# Patient Record
Sex: Male | Born: 1955 | Race: White | Hispanic: No | Marital: Married | Smoking: Current every day smoker
Health system: Southern US, Community
[De-identification: ages and names within clinical notes are randomized; demographics above are authoritative.]

## PROBLEM LIST (undated history)

## (undated) DIAGNOSIS — E669 Obesity, unspecified: Secondary | ICD-10-CM

## (undated) DIAGNOSIS — J449 Chronic obstructive pulmonary disease, unspecified: Secondary | ICD-10-CM

## (undated) DIAGNOSIS — C3490 Malignant neoplasm of unspecified part of unspecified bronchus or lung: Secondary | ICD-10-CM

## (undated) DIAGNOSIS — H269 Unspecified cataract: Secondary | ICD-10-CM

## (undated) DIAGNOSIS — N19 Unspecified kidney failure: Secondary | ICD-10-CM

## (undated) DIAGNOSIS — E785 Hyperlipidemia, unspecified: Secondary | ICD-10-CM

## (undated) DIAGNOSIS — H409 Unspecified glaucoma: Secondary | ICD-10-CM

## (undated) HISTORY — DX: Unspecified glaucoma: H40.9

## (undated) HISTORY — PX: BACK SURGERY: SHX140

## (undated) HISTORY — PX: TONSILLECTOMY: SUR1361

## (undated) HISTORY — DX: Malignant neoplasm of unspecified part of unspecified bronchus or lung: C34.90

## (undated) HISTORY — DX: Obesity, unspecified: E66.9

## (undated) HISTORY — DX: Unspecified kidney failure: N19

## (undated) HISTORY — DX: Unspecified cataract: H26.9

## (undated) HISTORY — DX: Hyperlipidemia, unspecified: E78.5

---

## 2005-09-04 ENCOUNTER — Emergency Department: Payer: Self-pay | Admitting: Emergency Medicine

## 2006-12-06 ENCOUNTER — Emergency Department (HOSPITAL_COMMUNITY): Admission: EM | Admit: 2006-12-06 | Discharge: 2006-12-07 | Payer: Self-pay | Admitting: Emergency Medicine

## 2007-09-27 ENCOUNTER — Emergency Department (HOSPITAL_COMMUNITY): Admission: EM | Admit: 2007-09-27 | Discharge: 2007-09-27 | Payer: Self-pay | Admitting: *Deleted

## 2008-03-22 ENCOUNTER — Other Ambulatory Visit: Payer: Self-pay

## 2008-03-22 ENCOUNTER — Inpatient Hospital Stay: Payer: Self-pay | Admitting: Internal Medicine

## 2010-04-13 ENCOUNTER — Encounter: Admission: RE | Admit: 2010-04-13 | Discharge: 2010-04-13 | Payer: Self-pay | Admitting: Specialist

## 2010-05-18 ENCOUNTER — Inpatient Hospital Stay (HOSPITAL_COMMUNITY): Admission: RE | Admit: 2010-05-18 | Discharge: 2010-05-19 | Payer: Self-pay | Admitting: Specialist

## 2011-01-18 ENCOUNTER — Emergency Department: Payer: Self-pay | Admitting: Emergency Medicine

## 2011-02-20 LAB — SURGICAL PCR SCREEN: MRSA, PCR: NEGATIVE

## 2011-02-20 LAB — URINALYSIS, ROUTINE W REFLEX MICROSCOPIC
Glucose, UA: NEGATIVE mg/dL
Hgb urine dipstick: NEGATIVE
Nitrite: NEGATIVE
Protein, ur: >300 mg/dL — AB
Urobilinogen, UA: 0.2 mg/dL (ref 0.0–1.0)
pH: 5.5 (ref 5.0–8.0)

## 2011-02-20 LAB — COMPREHENSIVE METABOLIC PANEL
AST: 19 U/L (ref 0–37)
Alkaline Phosphatase: 57 U/L (ref 39–117)
CO2: 31 mEq/L (ref 19–32)
Calcium: 9.7 mg/dL (ref 8.4–10.5)
Chloride: 106 mEq/L (ref 96–112)
Glucose, Bld: 93 mg/dL (ref 70–99)
Sodium: 144 mEq/L (ref 135–145)
Total Protein: 6.8 g/dL (ref 6.0–8.3)

## 2011-02-20 LAB — PROTIME-INR
INR: 0.98 (ref 0.00–1.49)
Prothrombin Time: 12.9 seconds (ref 11.6–15.2)

## 2011-02-20 LAB — CBC
Hemoglobin: 15.5 g/dL (ref 13.0–17.0)
MCHC: 33.2 g/dL (ref 30.0–36.0)
Platelets: 162 10*3/uL (ref 150–400)
RBC: 4.9 MIL/uL (ref 4.22–5.81)
RDW: 13.9 % (ref 11.5–15.5)

## 2011-02-20 LAB — APTT: aPTT: 28 seconds (ref 24–37)

## 2011-09-25 ENCOUNTER — Inpatient Hospital Stay: Payer: Self-pay | Admitting: Internal Medicine

## 2011-09-28 ENCOUNTER — Emergency Department: Payer: Self-pay | Admitting: Emergency Medicine

## 2012-06-14 ENCOUNTER — Emergency Department (HOSPITAL_COMMUNITY): Payer: Self-pay

## 2012-06-14 ENCOUNTER — Encounter (HOSPITAL_COMMUNITY): Payer: Self-pay | Admitting: *Deleted

## 2012-06-14 ENCOUNTER — Inpatient Hospital Stay (HOSPITAL_COMMUNITY)
Admission: EM | Admit: 2012-06-14 | Discharge: 2012-06-16 | DRG: 191 | Disposition: A | Discharge status: Home / Self Care | Payer: MEDICAID | Attending: Internal Medicine | Admitting: Internal Medicine

## 2012-06-14 DIAGNOSIS — R7309 Other abnormal glucose: Secondary | ICD-10-CM | POA: Diagnosis present

## 2012-06-14 DIAGNOSIS — N189 Chronic kidney disease, unspecified: Secondary | ICD-10-CM | POA: Diagnosis present

## 2012-06-14 DIAGNOSIS — R0602 Shortness of breath: Secondary | ICD-10-CM | POA: Diagnosis present

## 2012-06-14 DIAGNOSIS — M549 Dorsalgia, unspecified: Secondary | ICD-10-CM | POA: Diagnosis present

## 2012-06-14 DIAGNOSIS — Z720 Tobacco use: Secondary | ICD-10-CM | POA: Diagnosis present

## 2012-06-14 DIAGNOSIS — Z6841 Body Mass Index (BMI) 40.0 and over, adult: Secondary | ICD-10-CM

## 2012-06-14 DIAGNOSIS — J441 Chronic obstructive pulmonary disease with (acute) exacerbation: Secondary | ICD-10-CM

## 2012-06-14 DIAGNOSIS — E669 Obesity, unspecified: Secondary | ICD-10-CM | POA: Diagnosis present

## 2012-06-14 DIAGNOSIS — J209 Acute bronchitis, unspecified: Principal | ICD-10-CM | POA: Diagnosis present

## 2012-06-14 DIAGNOSIS — G8929 Other chronic pain: Secondary | ICD-10-CM | POA: Diagnosis present

## 2012-06-14 DIAGNOSIS — R600 Localized edema: Secondary | ICD-10-CM

## 2012-06-14 DIAGNOSIS — Z86718 Personal history of other venous thrombosis and embolism: Secondary | ICD-10-CM

## 2012-06-14 DIAGNOSIS — F17200 Nicotine dependence, unspecified, uncomplicated: Secondary | ICD-10-CM | POA: Diagnosis present

## 2012-06-14 DIAGNOSIS — R609 Edema, unspecified: Secondary | ICD-10-CM | POA: Diagnosis present

## 2012-06-14 DIAGNOSIS — R739 Hyperglycemia, unspecified: Secondary | ICD-10-CM | POA: Diagnosis present

## 2012-06-14 DIAGNOSIS — N19 Unspecified kidney failure: Secondary | ICD-10-CM | POA: Diagnosis present

## 2012-06-14 DIAGNOSIS — J440 Chronic obstructive pulmonary disease with acute lower respiratory infection: Principal | ICD-10-CM | POA: Diagnosis present

## 2012-06-14 HISTORY — DX: Chronic obstructive pulmonary disease, unspecified: J44.9

## 2012-06-14 MED ORDER — ALBUTEROL SULFATE (5 MG/ML) 0.5% IN NEBU
2.5000 mg | INHALATION_SOLUTION | Freq: Once | RESPIRATORY_TRACT | Status: AC
  Administered 2012-06-14: 2.5 mg via RESPIRATORY_TRACT
  Filled 2012-06-14: qty 1

## 2012-06-14 MED ORDER — IPRATROPIUM BROMIDE 0.02 % IN SOLN
0.5000 mg | Freq: Once | RESPIRATORY_TRACT | Status: AC
  Administered 2012-06-14: 0.5 mg via RESPIRATORY_TRACT
  Filled 2012-06-14: qty 2.5

## 2012-06-14 MED ORDER — PREDNISONE 20 MG PO TABS
60.0000 mg | ORAL_TABLET | Freq: Once | ORAL | Status: AC
  Administered 2012-06-14: 60 mg via ORAL
  Filled 2012-06-14: qty 3

## 2012-06-14 MED ORDER — IPRATROPIUM BROMIDE 0.02 % IN SOLN
1.0000 mg | Freq: Once | RESPIRATORY_TRACT | Status: AC
  Administered 2012-06-14: 1 mg via RESPIRATORY_TRACT
  Filled 2012-06-14: qty 2.5

## 2012-06-14 MED ORDER — ALBUTEROL (5 MG/ML) CONTINUOUS INHALATION SOLN
INHALATION_SOLUTION | RESPIRATORY_TRACT | Status: AC
  Filled 2012-06-14: qty 20

## 2012-06-14 MED ORDER — ALBUTEROL SULFATE (5 MG/ML) 0.5% IN NEBU
10.0000 mg | INHALATION_SOLUTION | Freq: Once | RESPIRATORY_TRACT | Status: AC
  Administered 2012-06-14: 10 mg via RESPIRATORY_TRACT

## 2012-06-14 NOTE — ED Provider Notes (Signed)
History     CSN: [id number]  Arrival date & time 06/14/12  2009   First MD Initiated Contact with Patient 06/14/12 2030      Chief Complaint  Patient presents with  . Shortness of Breath    HPI Pt was seen at 2035.  Per pt, c/o gradual onset and persistence of constant cough and SOB for the past 3 days.  Denies CP/palpitations, no back pain, no abd pain, no N/V/D, no fevers, no rash.     Past Medical History  Diagnosis Date  . COPD (chronic obstructive pulmonary disease)     Past Surgical History  Procedure Date  . Back surgery     History  Substance Use Topics  . Smoking status: Current Everyday Smoker -- 1.0 packs/day    Types: Cigarettes  . Smokeless tobacco: Not on file  . Alcohol Use: No    Review of Systems ROS: Statement: All systems negative except as marked or noted in the HPI; Constitutional: Negative for fever and chills. ; ; Eyes: Negative for eye pain, redness and discharge. ; ; ENMT: Negative for ear pain, hoarseness, nasal congestion, sinus pressure and sore throat. ; ; Cardiovascular: Negative for chest pain, palpitations, diaphoresis. ; ; Respiratory: +cough, SOB. Negative for wheezing and stridor. ; ; Gastrointestinal: Negative for nausea, vomiting, diarrhea, abdominal pain, blood in stool, hematemesis, jaundice and rectal bleeding. . ; ; Genitourinary: Negative for dysuria, flank pain and hematuria. ; ; Musculoskeletal: Negative for back pain and neck pain. Negative for swelling and trauma.; ; Skin: Negative for pruritus, rash, abrasions, blisters, bruising and skin lesion.; ; Neuro: Negative for headache, lightheadedness and neck stiffness. Negative for weakness, altered level of consciousness , altered mental status, extremity weakness, paresthesias, involuntary movement, seizure and syncope.     Allergies  Levaquin  Home Medications   Current Outpatient Rx  Name Route Sig Dispense Refill  . OXYCODONE-ACETAMINOPHEN 10-650 MG PO TABS Oral Take 1  tablet by mouth 2 (two) times daily.      BP 120/78  Pulse 103  Temp 98.2 F (36.8 C) (Oral)  Resp 26  Ht 6' (1.829 m)  Wt 290 lb (131.543 kg)  BMI 39.33 kg/m2  SpO2 96%  Physical Exam 2040: Physical examination:  Nursing notes reviewed; Vital signs and O2 SAT reviewed;  Constitutional: Well developed, Well nourished, Well hydrated, In no acute distress; Head:  Normocephalic, atraumatic; Eyes: EOMI, PERRL, No scleral icterus; ENMT: Mouth and pharynx normal, Mucous membranes moist; Neck: Supple, Full range of motion, No lymphadenopathy; Cardiovascular: Regular rate and rhythm, No gallop; Respiratory: Breath sounds coarse and diminished bilaterally, No wheezes.  Speaking full sentences with ease, Normal respiratory effort/excursion; Chest: Nontender, Movement normal; Abdomen: Soft, Nontender, Nondistended, Normal bowel sounds; Genitourinary: No CVA tenderness; Extremities: Pulses normal, No tenderness, 1+ pedal edema LLE/2+ pedal edema RLE with chronic stasis changes; Neuro: AA&Ox3, Major CN grossly intact.  Speech clear. No gross focal motor or sensory deficits in extremities.; Skin: Color normal, Warm, Dry.   ED Course  Procedures   MDM  MDM Reviewed: nursing note, vitals and previous chart Interpretation: x-ray     2220:  Pt's PCXR has just been completed but not read by Rads MD yet.  Pt has just started his continuous neb, has not received his prednisone.  Appears exacerbation COPD at this time, needs re-eval after tx, may need admit.  Sign out to Dr. Lynelle Doctor.         Laray Anger, DO 06/15/12 1258

## 2012-06-14 NOTE — ED Notes (Addendum)
Pt reporting SOB x3 days.  Reports difficulty laying flat. Reporting frequent cough, reporting cough is productive.

## 2012-06-15 ENCOUNTER — Encounter (HOSPITAL_COMMUNITY): Payer: Self-pay | Admitting: Internal Medicine

## 2012-06-15 ENCOUNTER — Inpatient Hospital Stay (HOSPITAL_COMMUNITY): Payer: Self-pay

## 2012-06-15 DIAGNOSIS — R609 Edema, unspecified: Secondary | ICD-10-CM

## 2012-06-15 DIAGNOSIS — N19 Unspecified kidney failure: Secondary | ICD-10-CM | POA: Diagnosis present

## 2012-06-15 DIAGNOSIS — Z720 Tobacco use: Secondary | ICD-10-CM | POA: Diagnosis present

## 2012-06-15 DIAGNOSIS — R739 Hyperglycemia, unspecified: Secondary | ICD-10-CM | POA: Diagnosis present

## 2012-06-15 DIAGNOSIS — R0602 Shortness of breath: Secondary | ICD-10-CM | POA: Diagnosis present

## 2012-06-15 DIAGNOSIS — G8929 Other chronic pain: Secondary | ICD-10-CM | POA: Diagnosis present

## 2012-06-15 DIAGNOSIS — J209 Acute bronchitis, unspecified: Secondary | ICD-10-CM | POA: Diagnosis present

## 2012-06-15 DIAGNOSIS — J441 Chronic obstructive pulmonary disease with (acute) exacerbation: Secondary | ICD-10-CM

## 2012-06-15 DIAGNOSIS — R7309 Other abnormal glucose: Secondary | ICD-10-CM

## 2012-06-15 HISTORY — DX: Unspecified kidney failure: N19

## 2012-06-15 LAB — URINE MICROSCOPIC-ADD ON

## 2012-06-15 LAB — GLUCOSE, CAPILLARY: Glucose-Capillary: 189 mg/dL — ABNORMAL HIGH (ref 70–99)

## 2012-06-15 LAB — CBC WITH DIFFERENTIAL/PLATELET
Basophils Absolute: 0.1 10*3/uL (ref 0.0–0.1)
Basophils Relative: 1 % (ref 0–1)
Hemoglobin: 14.8 g/dL (ref 13.0–17.0)
Lymphocytes Relative: 17 % (ref 12–46)
MCHC: 32.5 g/dL (ref 30.0–36.0)
Monocytes Relative: 7 % (ref 3–12)
Neutro Abs: 8.9 10*3/uL — ABNORMAL HIGH (ref 1.7–7.7)
Neutrophils Relative %: 73 % (ref 43–77)
WBC: 12.2 10*3/uL — ABNORMAL HIGH (ref 4.0–10.5)

## 2012-06-15 LAB — COMPREHENSIVE METABOLIC PANEL
ALT: 13 U/L (ref 0–53)
AST: 13 U/L (ref 0–37)
Albumin: 3.7 g/dL (ref 3.5–5.2)
Alkaline Phosphatase: 88 U/L (ref 39–117)
Alkaline Phosphatase: 75 U/L (ref 39–117)
BUN: 20 mg/dL (ref 6–23)
BUN: 20 mg/dL (ref 6–23)
CO2: 28 mEq/L (ref 19–32)
Chloride: 99 mEq/L (ref 96–112)
Chloride: 98 mEq/L (ref 96–112)
GFR calc Af Amer: 51 mL/min — ABNORMAL LOW (ref >90)
Glucose, Bld: 152 mg/dL — ABNORMAL HIGH (ref 70–99)
Potassium: 3.7 mEq/L (ref 3.5–5.1)
Potassium: 4.7 mEq/L (ref 3.5–5.1)
Sodium: 136 mEq/L (ref 135–145)
Total Bilirubin: 0.2 mg/dL — ABNORMAL LOW (ref 0.3–1.2)
Total Bilirubin: 0.2 mg/dL — ABNORMAL LOW (ref 0.3–1.2)
Total Protein: 7.2 g/dL (ref 6.0–8.3)

## 2012-06-15 LAB — URINALYSIS, ROUTINE W REFLEX MICROSCOPIC
Bilirubin Urine: NEGATIVE
Glucose, UA: NEGATIVE mg/dL
Specific Gravity, Urine: <1.005 — ABNORMAL LOW (ref 1.005–1.030)
pH: 6 (ref 5.0–8.0)

## 2012-06-15 LAB — CBC
HCT: 44.8 % (ref 39.0–52.0)
Hemoglobin: 14.7 g/dL (ref 13.0–17.0)
MCHC: 32.8 g/dL (ref 30.0–36.0)

## 2012-06-15 LAB — PRO B NATRIURETIC PEPTIDE: Pro B Natriuretic peptide (BNP): 60.4 pg/mL (ref 0–125)

## 2012-06-15 MED ORDER — ALBUTEROL SULFATE (5 MG/ML) 0.5% IN NEBU
2.5000 mg | INHALATION_SOLUTION | RESPIRATORY_TRACT | Status: DC
  Administered 2012-06-15 – 2012-06-16 (×5): 2.5 mg via RESPIRATORY_TRACT
  Filled 2012-06-15 (×6): qty 0.5

## 2012-06-15 MED ORDER — INSULIN ASPART 100 UNIT/ML ~~LOC~~ SOLN: 0.0000 [IU] | Freq: Every day | SUBCUTANEOUS | Status: DC

## 2012-06-15 MED ORDER — OXYCODONE HCL 5 MG PO TABS: 5.0000 mg | ORAL_TABLET | ORAL | Status: DC | PRN

## 2012-06-15 MED ORDER — ONDANSETRON HCL 4 MG/2ML IJ SOLN: 4.0000 mg | INTRAMUSCULAR | Status: DC | PRN

## 2012-06-15 MED ORDER — GUAIFENESIN ER 600 MG PO TB12
1200.0000 mg | ORAL_TABLET | Freq: Two times a day (BID) | ORAL | Status: DC
  Filled 2012-06-15: qty 2

## 2012-06-15 MED ORDER — SODIUM CHLORIDE 0.9 % IV SOLN: INTRAVENOUS | Status: DC

## 2012-06-15 MED ORDER — SODIUM CHLORIDE 0.9 % IJ SOLN
3.0000 mL | Freq: Two times a day (BID) | INTRAMUSCULAR | Status: DC
  Administered 2012-06-15 – 2012-06-16 (×2): 3 mL via INTRAVENOUS
  Filled 2012-06-15 (×3): qty 3

## 2012-06-15 MED ORDER — DEXTROSE 5 % IV SOLN
500.0000 mg | INTRAVENOUS | Status: DC
  Filled 2012-06-15 (×2): qty 500

## 2012-06-15 MED ORDER — IPRATROPIUM BROMIDE 0.02 % IN SOLN
0.5000 mg | Freq: Four times a day (QID) | RESPIRATORY_TRACT | Status: DC
  Administered 2012-06-15 – 2012-06-16 (×4): 0.5 mg via RESPIRATORY_TRACT
  Filled 2012-06-15 (×5): qty 2.5

## 2012-06-15 MED ORDER — ENOXAPARIN SODIUM 40 MG/0.4ML ~~LOC~~ SOLN
40.0000 mg | SUBCUTANEOUS | Status: DC
  Administered 2012-06-16: 40 mg via SUBCUTANEOUS
  Filled 2012-06-15: qty 0.4

## 2012-06-15 MED ORDER — METHYLPREDNISOLONE SODIUM SUCC 125 MG IJ SOLR
125.0000 mg | Freq: Four times a day (QID) | INTRAMUSCULAR | Status: DC
  Administered 2012-06-15: 125 mg via INTRAVENOUS
  Filled 2012-06-15: qty 2

## 2012-06-15 MED ORDER — IPRATROPIUM BROMIDE 0.02 % IN SOLN
0.5000 mg | Freq: Once | RESPIRATORY_TRACT | Status: AC
  Administered 2012-06-15: 0.5 mg via RESPIRATORY_TRACT
  Filled 2012-06-15: qty 2.5

## 2012-06-15 MED ORDER — SODIUM CHLORIDE 0.9 % IV BOLUS (SEPSIS): 1000.0000 mL | Freq: Once | INTRAVENOUS | Status: AC

## 2012-06-15 MED ORDER — TRAZODONE HCL 50 MG PO TABS: 25.0000 mg | ORAL_TABLET | Freq: Every evening | ORAL | Status: DC | PRN

## 2012-06-15 MED ORDER — ALBUTEROL SULFATE (5 MG/ML) 0.5% IN NEBU: 2.5000 mg | INHALATION_SOLUTION | RESPIRATORY_TRACT | Status: DC | PRN

## 2012-06-15 MED ORDER — ALBUTEROL SULFATE (5 MG/ML) 0.5% IN NEBU
5.0000 mg | INHALATION_SOLUTION | Freq: Once | RESPIRATORY_TRACT | Status: AC
  Administered 2012-06-15: 5 mg via RESPIRATORY_TRACT
  Filled 2012-06-15: qty 1

## 2012-06-15 MED ORDER — DOCUSATE SODIUM 100 MG PO CAPS
100.0000 mg | ORAL_CAPSULE | Freq: Two times a day (BID) | ORAL | Status: DC
  Administered 2012-06-15 – 2012-06-16 (×3): 100 mg via ORAL
  Filled 2012-06-15 (×3): qty 1

## 2012-06-15 MED ORDER — ENOXAPARIN SODIUM 150 MG/ML ~~LOC~~ SOLN
1.0000 mg/kg | Freq: Two times a day (BID) | SUBCUTANEOUS | Status: DC
  Administered 2012-06-15: 130 mg via SUBCUTANEOUS
  Filled 2012-06-15 (×6): qty 1

## 2012-06-15 MED ORDER — DEXTROSE 5 % IV SOLN
1.0000 g | INTRAVENOUS | Status: DC
  Filled 2012-06-15 (×2): qty 10

## 2012-06-15 MED ORDER — PREDNISONE 20 MG PO TABS
60.0000 mg | ORAL_TABLET | Freq: Two times a day (BID) | ORAL | Status: DC
  Administered 2012-06-15 – 2012-06-16 (×3): 60 mg via ORAL
  Filled 2012-06-15 (×3): qty 3

## 2012-06-15 MED ORDER — ENOXAPARIN SODIUM 30 MG/0.3ML ~~LOC~~ SOLN
130.0000 mg | Freq: Once | SUBCUTANEOUS | Status: DC
  Filled 2012-06-15: qty 0.3

## 2012-06-15 MED ORDER — BISACODYL 5 MG PO TBEC: 5.0000 mg | DELAYED_RELEASE_TABLET | Freq: Every day | ORAL | Status: DC | PRN

## 2012-06-15 MED ORDER — IPRATROPIUM BROMIDE 0.02 % IN SOLN
0.5000 mg | RESPIRATORY_TRACT | Status: DC
  Administered 2012-06-15: 0.5 mg via RESPIRATORY_TRACT
  Filled 2012-06-15: qty 2.5

## 2012-06-15 MED ORDER — INSULIN ASPART 100 UNIT/ML ~~LOC~~ SOLN
0.0000 [IU] | Freq: Three times a day (TID) | SUBCUTANEOUS | Status: DC
  Administered 2012-06-15 (×2): 3 [IU] via SUBCUTANEOUS
  Administered 2012-06-15 – 2012-06-16 (×2): 2 [IU] via SUBCUTANEOUS

## 2012-06-15 MED ORDER — BENZONATATE 100 MG PO CAPS
200.0000 mg | ORAL_CAPSULE | Freq: Three times a day (TID) | ORAL | Status: DC
  Administered 2012-06-15 – 2012-06-16 (×4): 200 mg via ORAL
  Filled 2012-06-15 (×4): qty 2

## 2012-06-15 MED ORDER — DEXTROSE 5 % IV SOLN
1.0000 g | Freq: Once | INTRAVENOUS | Status: AC
  Administered 2012-06-15: 1 g via INTRAVENOUS
  Filled 2012-06-15: qty 10

## 2012-06-15 MED ORDER — SODIUM CHLORIDE 0.9 % IV SOLN
INTRAVENOUS | Status: DC
  Administered 2012-06-15: 05:00:00 via INTRAVENOUS

## 2012-06-15 MED ORDER — AZITHROMYCIN 250 MG PO TABS
250.0000 mg | ORAL_TABLET | Freq: Every day | ORAL | Status: DC
  Administered 2012-06-15 – 2012-06-16 (×2): 250 mg via ORAL
  Filled 2012-06-15 (×2): qty 1

## 2012-06-15 MED ORDER — SODIUM CHLORIDE 0.9 % IV BOLUS (SEPSIS)
500.0000 mL | INTRAVENOUS | Status: AC
  Administered 2012-06-15: 500 mL via INTRAVENOUS

## 2012-06-15 MED ORDER — AZITHROMYCIN 500 MG IV SOLR
500.0000 mg | Freq: Once | INTRAVENOUS | Status: AC
  Administered 2012-06-15: 500 mg via INTRAVENOUS
  Filled 2012-06-15: qty 500

## 2012-06-15 MED ORDER — ACETAMINOPHEN 650 MG RE SUPP: 650.0000 mg | Freq: Four times a day (QID) | RECTAL | Status: DC | PRN

## 2012-06-15 MED ORDER — ACETAMINOPHEN 325 MG PO TABS: 650.0000 mg | ORAL_TABLET | ORAL | Status: DC | PRN

## 2012-06-15 MED ORDER — HYDROMORPHONE HCL PF 1 MG/ML IJ SOLN
0.5000 mg | INTRAMUSCULAR | Status: DC | PRN
  Administered 2012-06-15 (×4): 0.5 mg via INTRAVENOUS
  Filled 2012-06-15 (×4): qty 1

## 2012-06-15 MED ORDER — NICOTINE 21 MG/24HR TD PT24
21.0000 mg | MEDICATED_PATCH | Freq: Every day | TRANSDERMAL | Status: DC
  Administered 2012-06-15 – 2012-06-16 (×2): 21 mg via TRANSDERMAL
  Filled 2012-06-15 (×3): qty 1

## 2012-06-15 NOTE — Progress Notes (Signed)
Patient admitted after midnight. Notes reviewed. Patient examined. No longer wheezing. Doppler shows no DVT. See orders.

## 2012-06-15 NOTE — ED Provider Notes (Cosign Needed)
History     CSN: [id number]  Arrival date & time 06/14/12  2009   First MD Initiated Contact with Patient 06/14/12 2030      Chief Complaint  Patient presents with  . Shortness of Breath    (Consider location/radiation/quality/duration/timing/severity/associated sxs/prior treatment) HPI  Patient left to me at change of  shift. Recheck after continuous nebulizer and oral prednisone shows very poor air movement and patient noted to be coughing frequently and appears to be tight. He states he was last admitted to hospital in October when he had pneumonia. Patient also concerned about swelling in his right leg he has had intermittently but has gotten constant. Pt continues to smoke    Past Medical History  Diagnosis Date  . COPD (chronic obstructive pulmonary disease)      Review of Systems  NA  Allergies  Levaquin       Physical Exam   ED Course  Procedures (including critical care time)   Medications  0.9 %  sodium chloride infusion (not administered)  sodium chloride 0.9 % bolus 500 mL (not administered)  enoxaparin (LOVENOX) injection 130 mg (not administered)  cefTRIAXone (ROCEPHIN) 1 g in dextrose 5 % 50 mL IVPB (not administered)  azithromycin (ZITHROMAX) 500 mg in dextrose 5 % 250 mL IVPB (not administered)  albuterol (PROVENTIL) (5 MG/ML) 0.5% nebulizer solution 2.5 mg (2.5 mg Nebulization Given 06/14/12 2022)  ipratropium (ATROVENT) nebulizer solution 0.5 mg (0.5 mg Nebulization Given 06/14/12 2023)  albuterol (PROVENTIL) (5 MG/ML) 0.5% nebulizer solution 10 mg (10 mg Nebulization Given 06/14/12 2108)  ipratropium (ATROVENT) nebulizer solution 1 mg (1 mg Nebulization Given 06/14/12 2108)  predniSONE (DELTASONE) tablet 60 mg (60 mg Oral Given 06/14/12 2251)  albuterol (PROVENTIL) (5 MG/ML) 0.5% nebulizer solution 5 mg (5 mg Nebulization Given 06/15/12 0112)  ipratropium (ATROVENT) nebulizer solution 0.5 mg (0.5 mg Nebulization Given 06/15/12 0112)    After  continuous nebulizer, the patient had IV inserted and received IV antibiotics in another albuterol nebulizer treatment. It was felt he needed to be admitted patient is agreeable.  01:36 Dr Orvan Falconer admit to telemetry     Results for orders placed during the hospital encounter of 06/14/12  CBC WITH DIFFERENTIAL      Component Value Range   WBC 12.2 (*) 4.0 - 10.5 K/uL   RBC 4.81  4.22 - 5.81 MIL/uL   Hemoglobin 14.8  13.0 - 17.0 g/dL   HCT 40.9  81.1 - 91.4 %   MCV 94.6  78.0 - 100.0 fL   MCH 30.8  26.0 - 34.0 pg   MCHC 32.5  30.0 - 36.0 g/dL   RDW 78.2  95.6 - 21.3 %   Platelets 186  150 - 400 K/uL   Neutrophils Relative 73  43 - 77 %   Neutro Abs 8.9 (*) 1.7 - 7.7 K/uL   Lymphocytes Relative 17  12 - 46 %   Lymphs Abs 2.1  0.7 - 4.0 K/uL   Monocytes Relative 7  3 - 12 %   Monocytes Absolute 0.9  0.1 - 1.0 K/uL   Eosinophils Relative 2  0 - 5 %   Eosinophils Absolute 0.2  0.0 - 0.7 K/uL   Basophils Relative 1  0 - 1 %   Basophils Absolute 0.1  0.0 - 0.1 K/uL  COMPREHENSIVE METABOLIC PANEL      Component Value Range   Sodium 139  135 - 145 mEq/L   Potassium 3.7  3.5 - 5.1 mEq/L  Chloride 99  96 - 112 mEq/L   CO2 28  19 - 32 mEq/L   Glucose, Bld 157 (*) 70 - 99 mg/dL   BUN 20  6 - 23 mg/dL   Creatinine, Ser 7.82 (*) 0.50 - 1.35 mg/dL   Calcium 9.9  8.4 - 95.6 mg/dL   Total Protein 7.4  6.0 - 8.3 g/dL   Albumin 3.7  3.5 - 5.2 g/dL   AST 13  0 - 37 U/L   ALT 14  0 - 53 U/L   Alkaline Phosphatase 88  39 - 117 U/L   Total Bilirubin 0.2 (*) 0.3 - 1.2 mg/dL   GFR calc non Af Amer 43 (*) >90 mL/min   GFR calc Af Amer 50 (*) >90 mL/min  D-DIMER, QUANTITATIVE      Component Value Range   D-Dimer, Quant 0.29  0.00 - 0.48 ug/mL-FEU   Laboratory interpretation all normal except , hyperglycemia   Dg Chest Port 1 View  06/14/2012  *RADIOLOGY REPORT*  Clinical Data: Respiratory distress.  PORTABLE CHEST - 1 VIEW  Comparison: None.  Findings: Mild cardiomegaly.  Mildly tortuous  aorta.  No gross pneumothorax.  Question abnormality below the right cardiac lead peripheral aspect right upper lung zone.  The patient would eventually benefit from follow-up two-view chest with cardiac leads removed.  Central pulmonary vascular prominence without pulmonary edema.  IMPRESSION: Mild cardiomegaly.  Central pulmonary vascular prominence without pulmonary edema.  Follow-up two-view chest recommended to evaluate the hila structures and possible opacity peripheral aspect right upper lobe.  Original Report Authenticated By: Fuller Canada, M.D.    1. COPD with acute exacerbation    Plan admission  Devoria Albe, MD, FACEP    MDM          Ward Givens, MD 06/15/12 [id number]

## 2012-06-15 NOTE — Progress Notes (Signed)
ANTICOAGULATION CONSULT NOTE - Initial Consult  Pharmacy Consult for Lovenox Indication: VTE treatment  Allergies  Allergen Reactions  . Levaquin (Levofloxacin) Other (See Comments)    REACTION: ORAL rash (breakout)    Patient Measurements: Height: 6' (182.9 cm) Weight: 290 lb (131.543 kg) IBW/kg (Calculated) : 77.6   Vital Signs: Temp: 98.2 F (36.8 C) (07/12 2010) Temp src: Oral (07/12 2010) BP: 102/61 mmHg (07/13 0014) Pulse Rate: 99  (07/13 0014)  Labs:  Basename 06/15/12 0025  HGB 14.8  HCT 45.5  PLT 186  APTT --  LABPROT --  INR --  HEPARINUNFRC --  CREATININE 1.70*  CKTOTAL --  CKMB --  TROPONINI --    Estimated Creatinine Clearance: 68.1 ml/min (by C-G formula based on Cr of 1.7).   Medical History: Past Medical History  Diagnosis Date  . COPD (chronic obstructive pulmonary disease)     Medications:  Scheduled:    . albuterol  10 mg Nebulization Once  . albuterol  2.5 mg Nebulization Once  . albuterol  5 mg Nebulization Once  . albuterol      . azithromycin  500 mg Intravenous Once  . enoxaparin (LOVENOX) injection  1 mg/kg Subcutaneous Q12H  . ipratropium  0.5 mg Nebulization Once  . ipratropium  0.5 mg Nebulization Once  . ipratropium  1 mg Nebulization Once  . predniSONE  60 mg Oral Once  . sodium chloride  1,000 mL Intravenous Once  . sodium chloride  500 mL Intravenous STAT  . DISCONTD: enoxaparin  130 mg Subcutaneous Once    Assessment: Ok for protocol CrCl greater than 30  Goal of Therapy:  VTE treatment Monitor platelets by anticoagulation protocol: Yes   Plan:  Lovenox 1 mg/kg every 12 hours (130 mg) Monitor platelets Labs per protocol  Raquel James, Nihal Marzella Bennett 06/15/2012,2:53 AM

## 2012-06-15 NOTE — ED Notes (Signed)
Patient earlier c/o hunger - MD had advised no solids until evaluated for admission.  Dr Orvan Falconer in to evaluate.  Orders received for diet - Healthy Choice Entree provided.

## 2012-06-15 NOTE — Consult Note (Signed)
See prior note

## 2012-06-15 NOTE — H&P (Signed)
[facility] History and Physical  Andrew Estes WGN:[id number] DOB: 03-27-1956 DOA: 06/15/2012   PCP:   No primary provider on file.   Pain management:: Lillia Dallas. Murray Hodgkins, M.D., [facility] neurosurgical  Chief Complaint:  Difficulty breathing for the past 4 days  HPI: Andrew Estes is an 56 y.o. male.   Obese Caucasian gentleman with no medical followup other than pain management clinic for a workers comp injury, has a 40-pack-year tobacco history, presents with worsening shortness of breath over the past 4 days. Now with severe orthopnea coughing up yellow mucus, worsening chest pain with coughing.  He denies fever or hemoptysis. He has also noted swelling of the right lower extremity for the past 3 weeks.  In the emergency room patient was noted to have generalized wheezing and had some improvement with serial continues to have significant shortness of breath and the hospitalist service was called to assist.  Because patient has no medical followup she denies any knowledge of kidney disease or diabetes.   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  Rewiew of Systems:   All systems negative except as marked or noted in the HPI;  Constitutional: Negative for malaise, fever and chills. ;  Eyes: Negative for eye pain, redness and discharge. ;  ENMT: Negative for ear pain, hoarseness, nasal congestion, sinus pressure and sore throat. ;  Cardiovascular: Negative for, palpitations, diaphoresis, dyspnea  ;  Respiratory: Negative for  hemoptysis, and stridor. ;  Gastrointestinal: Negative for nausea, vomiting, diarrhea, constipation, abdominal pain, melena, blood in stool, hematemesis, jaundice and rectal bleeding. unusual weight loss..   Genitourinary: Negative for frequency, dysuria, incontinence,flank pain and hematuria; Musculoskeletal: Negative  neck pain.  Negative trauma.;  Skin: . Negative for pruritus, rash, abrasions, bruising and skin lesion.; ulcerations Neuro: Negative for headache, lightheadedness and neck stiffness. Negative for weakness, altered level of consciousness , altered mental status, extremity weakness, burning feet, involuntary movement, seizure and syncope.  Psych: negative for anxiety, depression, insomnia, tearfulness, panic attacks, hallucinations, paranoia, suicidal or homicidal ideation    Past Medical History  Diagnosis Date  . COPD (chronic obstructive pulmonary disease)   . Chronic kidney disease     Past Surgical History  Procedure Date  . Back surgery     Medications:  HOME MEDS: Prior to Admission medications   Medication Sig Start Date End Date Taking? Authorizing Provider  oxyCODONE-acetaminophen (PERCOCET) 10-650 MG per tablet Take 1 tablet by mouth 2 (two) times daily.   Yes Historical Provider, MD     Allergies:  Allergies  Allergen Reactions  . Levaquin (Levofloxacin) Other (See Comments)    REACTION: ORAL rash (breakout)    Social History:   reports that he has been smoking Cigarettes.  He has a 45 pack-year smoking history. He does not have any smokeless tobacco history on file. He reports that he does not drink alcohol or use illicit drugs.  Family History: Family History  Problem Relation Age of Onset  . Cancer Mother 1    pancreas  . Diverticulitis Mother   . Osteoarthritis Father    father had multiple DVTs after multiple knee replacements    Physical Exam: Filed Vitals:   06/14/12 2246 06/15/12 0014 06/15/12 0113 06/15/12 0417  BP: 122/66 102/61  132/82  Pulse: 101 99  104  Temp:    98.7 F (37.1 C)  TempSrc:  Resp: 24 24  22   Height:      Weight:      SpO2: 100% 100% 98% 97%   Blood pressure 132/82, pulse 104, temperature 98.7 F (37.1 C), temperature source Oral, resp. rate 22, height 6' (1.829 m), weight 131.543 kg (290 lb), SpO2 97.00%.  GEN:  Pleasan  obese Caucasian gentleman sitting up   the stretcher in significant respiratory  distress; cooperative with exam PSYCH:  alert and oriented x4; does appear somewhat anxious; affect is appropriate. HEENT: Mucous membranes pink and anicteric; PERRLA; EOM intact; no cervical lymphadenopathy nor thyromegaly or carotid bruit; no JVD; very thick neck  Breasts:: Not examined CHEST WALL: No tenderness CHEST:  tachypneic; mild diffuse rhonchi HEART: Regular rate and rhythm; no murmurs rubs or gallops BACK: no CVA tenderness ABDOMEN: Obese,  firm  non-tender; no masses, no organomegaly, normal abdominal bowel sounds; no pannus; no intertriginous candida. Rectal Exam: Not done EXTREMITIES:; age-appropriate arthropathy of the hands and knees;  right leg swollen and tense;  no weeping,no ulcerations. skin of both legs it was red and dusky suggestive of venous stasis  Genitalia: not examined PULSES: 2+ and symmetric SKIN: Normal hydration no rash or ulceration CNS: Cranial nerves 2-12 grossly intact no focal lateralizing neurologic deficit   Labs on Admission:  Basic Metabolic Panel:  Lab 06/15/12 1610  NA 139  K 3.7  CL 99  CO2 28  GLUCOSE 157*  BUN 20  CREATININE 1.70*  CALCIUM 9.9  MG --  PHOS --   Liver Function Tests:  Lab 06/15/12 0025  AST 13  ALT 14  ALKPHOS 88  BILITOT 0.2*  PROT 7.4  ALBUMIN 3.7   No results found for this basename: LIPASE:5,AMYLASE:5 in the last 168 hours No results found for this basename: AMMONIA:5 in the last 168 hours CBC:  Lab 06/15/12 0025  WBC 12.2*  NEUTROABS 8.9*  HGB 14.8  HCT 45.5  MCV 94.6  PLT 186   Cardiac Enzymes: No results found for this basename: CKTOTAL:5,CKMB:5,CKMBINDEX:5,TROPONINI:5 in the last 168 hours BNP: No components found with this basename: POCBNP:5 CBG: No results found for this basename: GLUCAP:5 in the last 168 hours  Radiological Exams on Admission: Dg Chest Port 1 View  06/14/2012  *RADIOLOGY REPORT*   Clinical Data: Respiratory distress.  PORTABLE CHEST - 1 VIEW  Comparison: None.  Findings: Mild cardiomegaly.  Mildly tortuous aorta.  No gross pneumothorax.  Question abnormality below the right cardiac lead peripheral aspect right upper lung zone.  The patient would eventually benefit from follow-up two-view chest with cardiac leads removed.  Central pulmonary vascular prominence without pulmonary edema.  IMPRESSION: Mild cardiomegaly.  Central pulmonary vascular prominence without pulmonary edema.  Follow-up two-view chest recommended to evaluate the hila structures and possible opacity peripheral aspect right upper lobe.  Original Report Authenticated By: Fuller Canada, M.D.      Assessment/Plan Present on Admission:  .COPD (chronic obstructive pulmonary disease) with acute bronchitis   Serial nebulizations steroids and antibiotic therapy  .SOB (shortness of breath) possible Pulmonary embolus  Her respiratory distress is out of keeping with the lung exam, although by this time patient has had steroids and nebs; [place] swollen right leg will continue to consider venous thromboembolism, check a d-dimer and begin for anti-coagulation  .Leg edema, right, psooible DVT  For anti-coagulation pending extremity Doppler; because of a history of multiple DVTs in his father will also get a hypercoagulable panel  .Renal failure, unspecified  Possibly acute possibly chronic,  specially in light of hyperglycemia; Will hydrate and monitor renal function; check urinalysis  .Obesity  Counseled on weight management and diet; he need nutritional assistance   .Hyperglycemia  Check hemoglobin A1c counseled on diet and the importance of weight loss  .Tobacco abuse  Nicotine replacement ; counseled on tobacco cessation .Chronic back pain  Continue when necessary narcotics   Other plans as per orders.  Code Status:  full code  Family Communication: Patient was interviewed and examined with his permission  in the presence of his daughter and son-in-law  Disposition Plan: will depend on his response to treatment over the next few days; and the results of his venous thromboembolism exam   Critical care time: 60 minutes.   Claudia Greenley Nocturnist [facility] Pager [phone]  06/15/2012, 4:25 AM

## 2012-06-16 DIAGNOSIS — F17200 Nicotine dependence, unspecified, uncomplicated: Secondary | ICD-10-CM

## 2012-06-16 DIAGNOSIS — J209 Acute bronchitis, unspecified: Secondary | ICD-10-CM

## 2012-06-16 DIAGNOSIS — J440 Chronic obstructive pulmonary disease with acute lower respiratory infection: Secondary | ICD-10-CM

## 2012-06-16 DIAGNOSIS — J441 Chronic obstructive pulmonary disease with (acute) exacerbation: Secondary | ICD-10-CM

## 2012-06-16 DIAGNOSIS — R609 Edema, unspecified: Secondary | ICD-10-CM

## 2012-06-16 LAB — GLUCOSE, CAPILLARY: Glucose-Capillary: 134 mg/dL — ABNORMAL HIGH (ref 70–99)

## 2012-06-16 LAB — CBC
MCH: 30.6 pg (ref 26.0–34.0)
MCHC: 32.7 g/dL (ref 30.0–36.0)
MCV: 93.5 fL (ref 78.0–100.0)
Platelets: 214 10*3/uL (ref 150–400)
RDW: 13.3 % (ref 11.5–15.5)

## 2012-06-16 LAB — LIPID PANEL
Cholesterol: 234 mg/dL — ABNORMAL HIGH (ref 0–200)
HDL: 43 mg/dL (ref >39)
Total CHOL/HDL Ratio: 5.4 RATIO
VLDL: 23 mg/dL (ref 0–40)

## 2012-06-16 LAB — BASIC METABOLIC PANEL
Calcium: 9.9 mg/dL (ref 8.4–10.5)
Creatinine, Ser: 1.55 mg/dL — ABNORMAL HIGH (ref 0.50–1.35)
GFR calc Af Amer: 56 mL/min — ABNORMAL LOW (ref >90)
GFR calc non Af Amer: 48 mL/min — ABNORMAL LOW (ref >90)
Sodium: 138 mEq/L (ref 135–145)

## 2012-06-16 LAB — TSH: TSH: 2.118 u[IU]/mL (ref 0.350–4.500)

## 2012-06-16 LAB — HOMOCYSTEINE: Homocysteine: 15.4 umol/L (ref 4.0–15.4)

## 2012-06-16 MED ORDER — FUROSEMIDE 20 MG PO TABS: 20.0000 mg | ORAL_TABLET | Freq: Every day | ORAL | Status: DC | PRN

## 2012-06-16 MED ORDER — ALBUTEROL SULFATE HFA 108 (90 BASE) MCG/ACT IN AERS: 2.0000 | INHALATION_SPRAY | RESPIRATORY_TRACT | Status: DC | PRN

## 2012-06-16 MED ORDER — PREDNISONE (PAK) 10 MG PO TABS: 10.0000 mg | ORAL_TABLET | Freq: Every day | ORAL | Status: AC

## 2012-06-16 MED ORDER — AZITHROMYCIN 250 MG PO TABS: 250.0000 mg | ORAL_TABLET | Freq: Every day | ORAL | Status: DC

## 2012-06-16 NOTE — Progress Notes (Signed)
Reviewed discharge instructions with pt and he is able to teach back instructions.  Pt is alert and oriented, VSS.  Pt has requested to walk downstairs.  Anitah, NT will escort pt downstairs to lobby.  Pt has belongings in hand along with discharge instructions and prescriptions.

## 2012-06-16 NOTE — Discharge Summary (Signed)
Physician Discharge Summary  Patient ID: Andrew Estes MRN: [id number] DOB/AGE: December 19, 1955 56 y.o.  Admit date: 06/14/2012 Discharge date: 06/16/2012  Discharge Diagnoses:  Active Problems:  COPD (chronic obstructive pulmonary disease) with acute bronchitis  SOB (shortness of breath) possible Pulmonary embolus  Leg edema, right, psooible DVT  Renal failure, unspecified  Obesity  Hyperglycemia  Tobacco abuse  Chronic back pain   Medication List  As of 06/16/2012 10:22 AM   TAKE these medications         albuterol 108 (90 BASE) MCG/ACT inhaler   Commonly known as: PROVENTIL HFA;VENTOLIN HFA   Inhale 2 puffs into the lungs every 4 (four) hours as needed for wheezing.      azithromycin 250 MG tablet   Commonly known as: ZITHROMAX   Take 1 tablet (250 mg total) by mouth daily.      oxyCODONE-acetaminophen 10-650 MG per tablet   Commonly known as: PERCOCET   Take 1 tablet by mouth 2 (two) times daily.      predniSONE 10 MG tablet   Commonly known as: STERAPRED UNI-PAK   Take 1 tablet (10 mg total) by mouth daily. 4 tablets daily then decrease by 1 tablet every 2 days until off           furosemide 20 mg daily as needed for swelling  Discharge Orders    Future Orders Please Complete By Expires   Diet general      Increase activity slowly      Discharge instructions      Comments:   Quit smoking      Follow-up Information    Schedule an appointment as soon as possible for a [facility] with [facility] if you qualify.       Disposition: home  Discharged Condition: stable  Consults:  none  Labs:   Results for orders placed during the hospital encounter of 06/14/12 (from the past 48 hour(s))  CBC WITH DIFFERENTIAL     Status: Abnormal   Collection Time   06/15/12 12:25 AM      Component Value Range Comment   WBC 12.2 (*) 4.0 - 10.5 K/uL    RBC 4.81  4.22 - 5.81 MIL/uL    Hemoglobin 14.8  13.0 - 17.0 g/dL    HCT 78.2  95.6 - 21.3 %    MCV 94.6  78.0 - 100.0  fL    MCH 30.8  26.0 - 34.0 pg    MCHC 32.5  30.0 - 36.0 g/dL    RDW 08.6  57.8 - 46.9 %    Platelets 186  150 - 400 K/uL    Neutrophils Relative 73  43 - 77 %    Neutro Abs 8.9 (*) 1.7 - 7.7 K/uL    Lymphocytes Relative 17  12 - 46 %    Lymphs Abs 2.1  0.7 - 4.0 K/uL    Monocytes Relative 7  3 - 12 %    Monocytes Absolute 0.9  0.1 - 1.0 K/uL    Eosinophils Relative 2  0 - 5 %    Eosinophils Absolute 0.2  0.0 - 0.7 K/uL    Basophils Relative 1  0 - 1 %    Basophils Absolute 0.1  0.0 - 0.1 K/uL   COMPREHENSIVE METABOLIC PANEL     Status: Abnormal   Collection Time   06/15/12 12:25 AM      Component Value Range Comment   Sodium 139  135 - 145 mEq/L  Potassium 3.7  3.5 - 5.1 mEq/L    Chloride 99  96 - 112 mEq/L    CO2 28  19 - 32 mEq/L    Glucose, Bld 157 (*) 70 - 99 mg/dL    BUN 20  6 - 23 mg/dL    Creatinine, Ser 1.61 (*) 0.50 - 1.35 mg/dL    Calcium 9.9  8.4 - 09.6 mg/dL    Total Protein 7.4  6.0 - 8.3 g/dL    Albumin 3.7  3.5 - 5.2 g/dL    AST 13  0 - 37 U/L    ALT 14  0 - 53 U/L    Alkaline Phosphatase 88  39 - 117 U/L    Total Bilirubin 0.2 (*) 0.3 - 1.2 mg/dL    GFR calc non Af Amer 43 (*) >90 mL/min    GFR calc Af Amer 50 (*) >90 mL/min   D-DIMER, QUANTITATIVE     Status: Normal   Collection Time   06/15/12 12:25 AM      Component Value Range Comment   D-Dimer, Quant 0.29  0.00 - 0.48 ug/mL-FEU   ANTITHROMBIN III     Status: Normal   Collection Time   06/15/12  2:45 AM      Component Value Range Comment   AntiThromb III Func 96  75 - 120 %   HOMOCYSTEINE, SERUM     Status: Normal   Collection Time   06/15/12  2:45 AM      Component Value Range Comment   Homocysteine-Norm 15.4  4.0 - 15.4 umol/L   HEMOGLOBIN A1C     Status: Normal   Collection Time   06/15/12  2:45 AM      Component Value Range Comment   Hemoglobin A1C 5.1  <5.7 %    Mean Plasma Glucose 100  <117 mg/dL   TSH     Status: Normal   Collection Time   06/15/12  2:45 AM      Component Value Range  Comment   TSH 2.118  0.350 - 4.500 uIU/mL   URINALYSIS, ROUTINE W REFLEX MICROSCOPIC     Status: Abnormal   Collection Time   06/15/12  6:00 AM      Component Value Range Comment   Color, Urine YELLOW  YELLOW    APPearance CLEAR  CLEAR    Specific Gravity, Urine <1.005 (*) 1.005 - 1.030    pH 6.0  5.0 - 8.0    Glucose, UA NEGATIVE  NEGATIVE mg/dL    Hgb urine dipstick TRACE (*) NEGATIVE    Bilirubin Urine NEGATIVE  NEGATIVE    Ketones, ur NEGATIVE  NEGATIVE mg/dL    Protein, ur 045 (*) NEGATIVE mg/dL    Urobilinogen, UA 0.2  0.0 - 1.0 mg/dL    Nitrite NEGATIVE  NEGATIVE    Leukocytes, UA NEGATIVE  NEGATIVE   URINE MICROSCOPIC-ADD ON     Status: Abnormal   Collection Time   06/15/12  6:00 AM      Component Value Range Comment   Squamous Epithelial / LPF FEW (*) RARE    WBC, UA 0-2  <3 WBC/hpf    RBC / HPF 0-2  <3 RBC/hpf    Bacteria, UA RARE  RARE    Casts GRANULAR CAST (*) NEGATIVE   PRO B NATRIURETIC PEPTIDE     Status: Normal   Collection Time   06/15/12  7:13 AM      Component Value Range Comment   Pro  B Natriuretic peptide (BNP) 60.4  0 - 125 pg/mL   CBC     Status: Normal   Collection Time   06/15/12  7:19 AM      Component Value Range Comment   WBC 10.4  4.0 - 10.5 K/uL    RBC 4.78  4.22 - 5.81 MIL/uL    Hemoglobin 14.7  13.0 - 17.0 g/dL    HCT 16.1  09.6 - 04.5 %    MCV 93.7  78.0 - 100.0 fL    MCH 30.8  26.0 - 34.0 pg    MCHC 32.8  30.0 - 36.0 g/dL    RDW 40.9  81.1 - 91.4 %    Platelets 173  150 - 400 K/uL   COMPREHENSIVE METABOLIC PANEL     Status: Abnormal   Collection Time   06/15/12  7:19 AM      Component Value Range Comment   Sodium 136  135 - 145 mEq/L    Potassium 4.7  3.5 - 5.1 mEq/L DELTA CHECK NOTED   Chloride 98  96 - 112 mEq/L    CO2 25  19 - 32 mEq/L    Glucose, Bld 152 (*) 70 - 99 mg/dL    BUN 20  6 - 23 mg/dL    Creatinine, Ser 7.82 (*) 0.50 - 1.35 mg/dL    Calcium 9.6  8.4 - 95.6 mg/dL    Total Protein 7.2  6.0 - 8.3 g/dL    Albumin 3.4  (*) 3.5 - 5.2 g/dL    AST 13  0 - 37 U/L    ALT 13  0 - 53 U/L    Alkaline Phosphatase 75  39 - 117 U/L    Total Bilirubin 0.2 (*) 0.3 - 1.2 mg/dL    GFR calc non Af Amer 44 (*) >90 mL/min    GFR calc Af Amer 51 (*) >90 mL/min   GLUCOSE, CAPILLARY     Status: Abnormal   Collection Time   06/15/12  7:26 AM      Component Value Range Comment   Glucose-Capillary 142 (*) 70 - 99 mg/dL    Comment 1 Notify RN      Comment 2 Documented in Chart     GLUCOSE, CAPILLARY     Status: Abnormal   Collection Time   06/15/12 11:42 AM      Component Value Range Comment   Glucose-Capillary 164 (*) 70 - 99 mg/dL    Comment 1 Notify RN      Comment 2 Documented in Chart     GLUCOSE, CAPILLARY     Status: Abnormal   Collection Time   06/15/12  4:48 PM      Component Value Range Comment   Glucose-Capillary 153 (*) 70 - 99 mg/dL    Comment 1 Notify RN      Comment 2 Documented in Chart     GLUCOSE, CAPILLARY     Status: Abnormal   Collection Time   06/15/12  9:32 PM      Component Value Range Comment   Glucose-Capillary 189 (*) 70 - 99 mg/dL    Comment 1 Notify RN     LIPID PANEL     Status: Abnormal   Collection Time   06/16/12  5:50 AM      Component Value Range Comment   Cholesterol 234 (*) 0 - 200 mg/dL    Triglycerides 213  <086 mg/dL    HDL 43  >57 mg/dL  Total CHOL/HDL Ratio 5.4      VLDL 23  0 - 40 mg/dL    LDL Cholesterol 454 (*) 0 - 99 mg/dL   CBC     Status: Abnormal   Collection Time   06/16/12  5:50 AM      Component Value Range Comment   WBC 19.8 (*) 4.0 - 10.5 K/uL    RBC 4.94  4.22 - 5.81 MIL/uL    Hemoglobin 15.1  13.0 - 17.0 g/dL    HCT 09.8  11.9 - 14.7 %    MCV 93.5  78.0 - 100.0 fL    MCH 30.6  26.0 - 34.0 pg    MCHC 32.7  30.0 - 36.0 g/dL    RDW 82.9  56.2 - 13.0 %    Platelets 214  150 - 400 K/uL   BASIC METABOLIC PANEL     Status: Abnormal   Collection Time   06/16/12  5:50 AM      Component Value Range Comment   Sodium 138  135 - 145 mEq/L    Potassium 4.7   3.5 - 5.1 mEq/L    Chloride 100  96 - 112 mEq/L    CO2 29  19 - 32 mEq/L    Glucose, Bld 158 (*) 70 - 99 mg/dL    BUN 32 (*) 6 - 23 mg/dL DELTA CHECK NOTED   Creatinine, Ser 1.55 (*) 0.50 - 1.35 mg/dL    Calcium 9.9  8.4 - 86.5 mg/dL    GFR calc non Af Amer 48 (*) >90 mL/min    GFR calc Af Amer 56 (*) >90 mL/min   GLUCOSE, CAPILLARY     Status: Abnormal   Collection Time   06/16/12  7:42 AM      Component Value Range Comment   Glucose-Capillary 134 (*) 70 - 99 mg/dL    Comment 1 Notify RN      Comment 2 Documented in Chart       Diagnostics:  US Venous Img Lower Unilateral Right  06/15/2012  *RADIOLOGY REPORT*  Clinical Data: Right leg swelling.  RIGHT LOWER EXTREMITY VENOUS DUPLEX ULTRASOUND  Technique:  Gray-scale sonography with graded compression, as well as color Doppler and duplex ultrasound were performed to evaluate the deep venous system of the lower extremity from the level of the common femoral vein through the popliteal and proximal calf veins. Spectral Doppler was utilized to evaluate flow at rest and with distal augmentation maneuvers.  Comparison:  None.  Findings:  Normal compressibility of the common femoral, femoral, and popliteal veins is demonstrated, as well as the visualized proximal calf veins.  No filling defects to suggest DVT on grayscale or color Doppler imaging.  Doppler waveforms show normal direction of venous flow, normal respiratory phasicity and response to augmentation.  IMPRESSION: No evidence of right lower extremity deep vein thrombosis.  Original Report Authenticated By: Richarda Overlie, M.D.   Dg Chest Port 1 View  06/14/2012  *RADIOLOGY REPORT*  Clinical Data: Respiratory distress.  PORTABLE CHEST - 1 VIEW  Comparison: None.  Findings: Mild cardiomegaly.  Mildly tortuous aorta.  No gross pneumothorax.  Question abnormality below the right cardiac lead peripheral aspect right upper lung zone.  The patient would eventually benefit from follow-up two-view chest  with cardiac leads removed.  Central pulmonary vascular prominence without pulmonary edema.  IMPRESSION: Mild cardiomegaly.  Central pulmonary vascular prominence without pulmonary edema.  Follow-up two-view chest recommended to evaluate the hila structures and possible opacity  peripheral aspect right upper lobe.  Original Report Authenticated By: Fuller Canada, M.D.   EKG: Normal sinus rhythm Left anterior fascicular block Cannot rule out Inferior infarct (masked by fascicular block?) , age undetermined  Full Code   Hospital Course: See H&P for complete admission details. The patient is a 56 year old white male smoker who presented with shortness of breath. He also had a cough for several days and right leg swelling. He has no primary care physician, but tablet care at the [place], as he is a [place] veteran. On exam, he had tachypnea and rhonchi. Right greater than left leg edema. Heart rate was 104. She was afebrile and had normal oxygen saturations. D-dimer and Doppler of the leg were negative. Chest x-ray showed mild cardiomegaly and central pulmonary vascular prominence without pulmonary edema. Patient reports having had an echocardiogram at [facility] last year. We requested records but have not yet received them. Patient was started on antibiotics, steroids and bronchodilators for COPD exacerbation and acute bronchitis. His creatinine was found to be 1.6. After hydration at remained above 1.5. He may have an element of chronic kidney disease. His blood glucose was elevated on admission, but hemoglobin A1c was 5.6. By the time of discharge, he was feeling better. His lungs were clear. His vital signs are stable. His leg edema had improved. He was able to ambulate outside the hospital (without permission (. He is stable for discharge. We talked about the importance of quitting smoking and establishing a primary care provider. Total time on the day of discharge greater than 30  minutes.  Discharge Exam:  Blood pressure 129/87, pulse 89, temperature 97.7 F (36.5 C), temperature source Oral, resp. rate 18, height 6' (1.829 m), weight 139.708 kg (308 lb), SpO2 100.00%.  General: Comfortable. Talkative. Lungs clear to auscultation bilaterally without wheeze rhonchi or rales Cardiovascular regular rate rhythm without murmurs gallops rubs Abdomen soft nontender nondistended Extremities slightly less edema  Signed: Arelis Neumeier L 06/16/2012, 10:22 AM

## 2012-06-17 LAB — CARDIOLIPIN ANTIBODIES, IGG, IGM, IGA
Anticardiolipin IgG: 9 GPL U/mL — ABNORMAL LOW (ref <23)
Anticardiolipin IgM: 3 MPL U/mL — ABNORMAL LOW (ref <11)

## 2012-06-17 LAB — PROTEIN S ACTIVITY: Protein S Activity: 124 % (ref 69–129)

## 2012-06-17 LAB — BETA-2-GLYCOPROTEIN I ABS, IGG/M/A: Beta-2-Glycoprotein I IgA: 5 A Units (ref <20)

## 2012-06-17 LAB — LUPUS ANTICOAGULANT PANEL: PTTLA 4:1 Mix: 38 secs (ref 28.0–43.0)

## 2012-06-17 LAB — PROTEIN S, TOTAL: Protein S Ag, Total: 126 % (ref 60–150)

## 2012-06-17 LAB — FACTOR 5 LEIDEN

## 2013-12-31 ENCOUNTER — Other Ambulatory Visit: Payer: Self-pay

## 2013-12-31 ENCOUNTER — Encounter (HOSPITAL_COMMUNITY): Payer: Self-pay | Admitting: Emergency Medicine

## 2013-12-31 ENCOUNTER — Inpatient Hospital Stay (HOSPITAL_COMMUNITY)
Admission: EM | Admit: 2013-12-31 | Discharge: 2014-01-03 | DRG: 190 | Disposition: A | Discharge status: Home / Self Care | Payer: Medicare Other | Attending: Family Medicine | Admitting: Family Medicine

## 2013-12-31 ENCOUNTER — Emergency Department (HOSPITAL_COMMUNITY): Payer: Medicare Other

## 2013-12-31 DIAGNOSIS — R600 Localized edema: Secondary | ICD-10-CM | POA: Diagnosis present

## 2013-12-31 DIAGNOSIS — N19 Unspecified kidney failure: Secondary | ICD-10-CM

## 2013-12-31 DIAGNOSIS — R0602 Shortness of breath: Secondary | ICD-10-CM

## 2013-12-31 DIAGNOSIS — Z720 Tobacco use: Secondary | ICD-10-CM

## 2013-12-31 DIAGNOSIS — G8929 Other chronic pain: Secondary | ICD-10-CM | POA: Diagnosis present

## 2013-12-31 DIAGNOSIS — Z6841 Body Mass Index (BMI) 40.0 and over, adult: Secondary | ICD-10-CM

## 2013-12-31 DIAGNOSIS — J441 Chronic obstructive pulmonary disease with (acute) exacerbation: Principal | ICD-10-CM | POA: Diagnosis present

## 2013-12-31 DIAGNOSIS — J9600 Acute respiratory failure, unspecified whether with hypoxia or hypercapnia: Secondary | ICD-10-CM | POA: Diagnosis present

## 2013-12-31 DIAGNOSIS — T380X5A Adverse effect of glucocorticoids and synthetic analogues, initial encounter: Secondary | ICD-10-CM | POA: Diagnosis present

## 2013-12-31 DIAGNOSIS — F17200 Nicotine dependence, unspecified, uncomplicated: Secondary | ICD-10-CM | POA: Diagnosis present

## 2013-12-31 DIAGNOSIS — R739 Hyperglycemia, unspecified: Secondary | ICD-10-CM | POA: Diagnosis present

## 2013-12-31 DIAGNOSIS — M549 Dorsalgia, unspecified: Secondary | ICD-10-CM

## 2013-12-31 DIAGNOSIS — E785 Hyperlipidemia, unspecified: Secondary | ICD-10-CM | POA: Diagnosis present

## 2013-12-31 DIAGNOSIS — E669 Obesity, unspecified: Secondary | ICD-10-CM | POA: Diagnosis present

## 2013-12-31 DIAGNOSIS — R7309 Other abnormal glucose: Secondary | ICD-10-CM

## 2013-12-31 LAB — COMPREHENSIVE METABOLIC PANEL
ALT: 19 U/L (ref 0–53)
AST: 18 U/L (ref 0–37)
Albumin: 3.4 g/dL — ABNORMAL LOW (ref 3.5–5.2)
Alkaline Phosphatase: 86 U/L (ref 39–117)
BILIRUBIN TOTAL: 0.4 mg/dL (ref 0.3–1.2)
BUN: 17 mg/dL (ref 6–23)
CALCIUM: 8.8 mg/dL (ref 8.4–10.5)
CHLORIDE: 102 meq/L (ref 96–112)
CO2: 28 meq/L (ref 19–32)
CREATININE: 1.83 mg/dL — AB (ref 0.50–1.35)
GFR, EST AFRICAN AMERICAN: 46 mL/min — AB (ref >90)
GFR, EST NON AFRICAN AMERICAN: 39 mL/min — AB (ref >90)
GLUCOSE: 100 mg/dL — AB (ref 70–99)
Potassium: 4.6 mEq/L (ref 3.7–5.3)
SODIUM: 141 meq/L (ref 137–147)
Total Protein: 6.9 g/dL (ref 6.0–8.3)

## 2013-12-31 LAB — CBC WITH DIFFERENTIAL/PLATELET
Basophils Absolute: 0.1 10*3/uL (ref 0.0–0.1)
Basophils Relative: 1 % (ref 0–1)
EOS PCT: 5 % (ref 0–5)
Eosinophils Absolute: 0.3 10*3/uL (ref 0.0–0.7)
HEMATOCRIT: 43.7 % (ref 39.0–52.0)
Hemoglobin: 14.3 g/dL (ref 13.0–17.0)
LYMPHS ABS: 1.4 10*3/uL (ref 0.7–4.0)
LYMPHS PCT: 22 % (ref 12–46)
MCH: 31 pg (ref 26.0–34.0)
MCHC: 32.7 g/dL (ref 30.0–36.0)
MCV: 94.8 fL (ref 78.0–100.0)
MONO ABS: 0.8 10*3/uL (ref 0.1–1.0)
Monocytes Relative: 12 % (ref 3–12)
Neutro Abs: 3.9 10*3/uL (ref 1.7–7.7)
Neutrophils Relative %: 60 % (ref 43–77)
Platelets: 164 10*3/uL (ref 150–400)
RBC: 4.61 MIL/uL (ref 4.22–5.81)
RDW: 13.8 % (ref 11.5–15.5)
WBC: 6.5 10*3/uL (ref 4.0–10.5)

## 2013-12-31 LAB — BLOOD GAS, ARTERIAL
ACID-BASE EXCESS: 1.5 mmol/L (ref 0.0–2.0)
Bicarbonate: 25.9 mEq/L — ABNORMAL HIGH (ref 20.0–24.0)
DRAWN BY: 27407
O2 CONTENT: 2.5 L/min
O2 SAT: 95.6 %
PATIENT TEMPERATURE: 37
PCO2 ART: 42.8 mmHg (ref 35.0–45.0)
TCO2: 22.8 mmol/L (ref 0–100)
pH, Arterial: 7.398 (ref 7.350–7.450)
pO2, Arterial: 75.5 mmHg — ABNORMAL LOW (ref 80.0–100.0)

## 2013-12-31 LAB — TROPONIN I
Troponin I: <0.3 ng/mL (ref <0.30)
Troponin I: <0.3 ng/mL (ref <0.30)

## 2013-12-31 LAB — PRO B NATRIURETIC PEPTIDE: Pro B Natriuretic peptide (BNP): 91.7 pg/mL (ref 0–125)

## 2013-12-31 LAB — D-DIMER, QUANTITATIVE: D-Dimer, Quant: 0.27 ug/mL-FEU (ref 0.00–0.48)

## 2013-12-31 LAB — INFLUENZA PANEL BY PCR (TYPE A & B)
H1N1 flu by pcr: NOT DETECTED
INFLAPCR: NEGATIVE
INFLBPCR: NEGATIVE

## 2013-12-31 MED ORDER — METHYLPREDNISOLONE SODIUM SUCC 125 MG IJ SOLR
125.0000 mg | Freq: Once | INTRAMUSCULAR | Status: AC
  Administered 2013-12-31: 125 mg via INTRAVENOUS
  Filled 2013-12-31: qty 2

## 2013-12-31 MED ORDER — GUAIFENESIN-CODEINE 100-10 MG/5ML PO SOLN: 5.0000 mL | Freq: Four times a day (QID) | ORAL | Status: DC | PRN

## 2013-12-31 MED ORDER — SODIUM CHLORIDE 0.9 % IJ SOLN
3.0000 mL | Freq: Two times a day (BID) | INTRAMUSCULAR | Status: DC
  Administered 2013-12-31 – 2014-01-02 (×3): 3 mL via INTRAVENOUS

## 2013-12-31 MED ORDER — NICOTINE 21 MG/24HR TD PT24
21.0000 mg | MEDICATED_PATCH | Freq: Every day | TRANSDERMAL | Status: DC
  Administered 2013-12-31 – 2014-01-03 (×4): 21 mg via TRANSDERMAL
  Filled 2013-12-31 (×4): qty 1

## 2013-12-31 MED ORDER — ACETAMINOPHEN 325 MG PO TABS
650.0000 mg | ORAL_TABLET | Freq: Four times a day (QID) | ORAL | Status: DC | PRN
Start: 2013-12-31 — End: 2014-01-03

## 2013-12-31 MED ORDER — ONDANSETRON HCL 4 MG/2ML IJ SOLN
4.0000 mg | Freq: Four times a day (QID) | INTRAMUSCULAR | Status: DC | PRN
Start: 2013-12-31 — End: 2014-01-03
  Administered 2014-01-01: 4 mg via INTRAVENOUS
  Filled 2013-12-31: qty 2

## 2013-12-31 MED ORDER — IPRATROPIUM-ALBUTEROL 0.5-2.5 (3) MG/3ML IN SOLN
3.0000 mL | Freq: Once | RESPIRATORY_TRACT | Status: AC
  Administered 2013-12-31: 3 mL via RESPIRATORY_TRACT
  Filled 2013-12-31: qty 3

## 2013-12-31 MED ORDER — OXYCODONE HCL 5 MG PO TABS
5.0000 mg | ORAL_TABLET | Freq: Two times a day (BID) | ORAL | Status: DC
  Administered 2013-12-31 – 2014-01-03 (×5): 5 mg via ORAL
  Filled 2013-12-31 (×5): qty 1

## 2013-12-31 MED ORDER — DEXTROSE 5 % IV SOLN
1.0000 g | Freq: Once | INTRAVENOUS | Status: AC
  Administered 2013-12-31: 1 g via INTRAVENOUS
  Filled 2013-12-31: qty 10

## 2013-12-31 MED ORDER — ALBUTEROL SULFATE (2.5 MG/3ML) 0.083% IN NEBU
2.5000 mg | INHALATION_SOLUTION | Freq: Once | RESPIRATORY_TRACT | Status: AC
  Administered 2013-12-31: 2.5 mg via RESPIRATORY_TRACT
  Filled 2013-12-31: qty 3

## 2013-12-31 MED ORDER — IPRATROPIUM-ALBUTEROL 0.5-2.5 (3) MG/3ML IN SOLN
3.0000 mL | RESPIRATORY_TRACT | Status: DC
  Administered 2013-12-31 – 2014-01-01 (×4): 3 mL via RESPIRATORY_TRACT
  Filled 2013-12-31 (×4): qty 3

## 2013-12-31 MED ORDER — BUDESONIDE-FORMOTEROL FUMARATE 80-4.5 MCG/ACT IN AERO
2.0000 | INHALATION_SPRAY | Freq: Two times a day (BID) | RESPIRATORY_TRACT | Status: DC
Start: 2013-12-31 — End: 2014-01-03
  Administered 2013-12-31 – 2014-01-03 (×5): 2 via RESPIRATORY_TRACT
  Filled 2013-12-31: qty 6.9

## 2013-12-31 MED ORDER — OSELTAMIVIR PHOSPHATE 75 MG PO CAPS
75.0000 mg | ORAL_CAPSULE | Freq: Once | ORAL | Status: AC
  Administered 2013-12-31: 75 mg via ORAL
  Filled 2013-12-31: qty 1

## 2013-12-31 MED ORDER — HYDROCODONE-ACETAMINOPHEN 5-325 MG PO TABS: 1.0000 | ORAL_TABLET | ORAL | Status: DC | PRN

## 2013-12-31 MED ORDER — IPRATROPIUM BROMIDE 0.02 % IN SOLN: 0.5000 mg | Freq: Once | RESPIRATORY_TRACT | Status: DC

## 2013-12-31 MED ORDER — ALBUTEROL SULFATE (2.5 MG/3ML) 0.083% IN NEBU: 5.0000 mg | INHALATION_SOLUTION | Freq: Once | RESPIRATORY_TRACT | Status: DC

## 2013-12-31 MED ORDER — HYDROMORPHONE HCL PF 1 MG/ML IJ SOLN
1.0000 mg | INTRAMUSCULAR | Status: DC | PRN
  Administered 2013-12-31 (×2): 1 mg via INTRAVENOUS
  Filled 2013-12-31 (×2): qty 1

## 2013-12-31 MED ORDER — ALUM & MAG HYDROXIDE-SIMETH 200-200-20 MG/5ML PO SUSP: 30.0000 mL | Freq: Four times a day (QID) | ORAL | Status: DC | PRN

## 2013-12-31 MED ORDER — ENOXAPARIN SODIUM 40 MG/0.4ML ~~LOC~~ SOLN
40.0000 mg | SUBCUTANEOUS | Status: DC
  Administered 2013-12-31 – 2014-01-02 (×3): 40 mg via SUBCUTANEOUS
  Filled 2013-12-31 (×3): qty 0.4

## 2013-12-31 MED ORDER — ACETAMINOPHEN 650 MG RE SUPP: 650.0000 mg | Freq: Four times a day (QID) | RECTAL | Status: DC | PRN

## 2013-12-31 MED ORDER — GUAIFENESIN-DM 100-10 MG/5ML PO SYRP: 5.0000 mL | ORAL_SOLUTION | ORAL | Status: DC | PRN

## 2013-12-31 MED ORDER — ALBUTEROL SULFATE (2.5 MG/3ML) 0.083% IN NEBU
5.0000 mg | INHALATION_SOLUTION | Freq: Once | RESPIRATORY_TRACT | Status: AC
  Administered 2013-12-31: 5 mg via RESPIRATORY_TRACT
  Filled 2013-12-31: qty 6

## 2013-12-31 MED ORDER — TEMAZEPAM 15 MG PO CAPS
15.0000 mg | ORAL_CAPSULE | Freq: Once | ORAL | Status: AC
  Administered 2013-12-31: 15 mg via ORAL
  Filled 2013-12-31: qty 1

## 2013-12-31 MED ORDER — BENZONATATE 100 MG PO CAPS
100.0000 mg | ORAL_CAPSULE | Freq: Three times a day (TID) | ORAL | Status: DC
  Administered 2013-12-31 – 2014-01-01 (×4): 100 mg via ORAL
  Filled 2013-12-31 (×4): qty 1

## 2013-12-31 MED ORDER — BUDESONIDE-FORMOTEROL FUMARATE 80-4.5 MCG/ACT IN AERO
INHALATION_SPRAY | RESPIRATORY_TRACT | Status: AC
Start: 2013-12-31 — End: 2013-12-31
  Filled 2013-12-31: qty 6.9

## 2013-12-31 MED ORDER — METHYLPREDNISOLONE SODIUM SUCC 125 MG IJ SOLR
60.0000 mg | Freq: Four times a day (QID) | INTRAMUSCULAR | Status: DC
  Administered 2013-12-31 – 2014-01-01 (×3): 60 mg via INTRAVENOUS
  Filled 2013-12-31 (×3): qty 2

## 2013-12-31 MED ORDER — OXYCODONE-ACETAMINOPHEN 5-325 MG PO TABS
1.0000 | ORAL_TABLET | Freq: Two times a day (BID) | ORAL | Status: DC
  Administered 2013-12-31 – 2014-01-03 (×5): 1 via ORAL
  Filled 2013-12-31 (×5): qty 1

## 2013-12-31 MED ORDER — ONDANSETRON HCL 4 MG PO TABS: 4.0000 mg | ORAL_TABLET | Freq: Four times a day (QID) | ORAL | Status: DC | PRN

## 2013-12-31 MED ORDER — AZITHROMYCIN 500 MG IV SOLR
500.0000 mg | Freq: Once | INTRAVENOUS | Status: AC
  Administered 2013-12-31: 500 mg via INTRAVENOUS

## 2013-12-31 MED ORDER — DEXTROSE 5 % IV SOLN
500.0000 mg | INTRAVENOUS | Status: DC
  Administered 2014-01-01 – 2014-01-02 (×2): 500 mg via INTRAVENOUS
  Filled 2013-12-31 (×5): qty 500

## 2013-12-31 MED ORDER — MENTHOL 3 MG MT LOZG: 1.0000 | LOZENGE | OROMUCOSAL | Status: DC | PRN

## 2013-12-31 MED ORDER — OXYCODONE-ACETAMINOPHEN 10-650 MG PO TABS: 1.0000 | ORAL_TABLET | Freq: Two times a day (BID) | ORAL | Status: DC

## 2013-12-31 MED ORDER — OSELTAMIVIR PHOSPHATE 75 MG PO CAPS: 75.0000 mg | ORAL_CAPSULE | Freq: Two times a day (BID) | ORAL | Status: DC

## 2013-12-31 MED ORDER — ALBUTEROL SULFATE (2.5 MG/3ML) 0.083% IN NEBU
2.5000 mg | INHALATION_SOLUTION | RESPIRATORY_TRACT | Status: DC | PRN
  Administered 2014-01-01: 2.5 mg via RESPIRATORY_TRACT
  Filled 2013-12-31: qty 3

## 2013-12-31 MED ORDER — DEXTROSE 5 % IV SOLN
1.0000 g | INTRAVENOUS | Status: DC
  Administered 2014-01-01: 1 g via INTRAVENOUS
  Filled 2013-12-31 (×4): qty 10

## 2013-12-31 NOTE — ED Notes (Signed)
Pt c/o SOB and chest pain x1 day. Chest pain is on right side. Describes chest pain as pressure. Pt reports cough x1-2 days. Pt has hx of COPD and asthma. No inhaler or neb at home.

## 2013-12-31 NOTE — ED Provider Notes (Signed)
CSN: [id number]     Arrival date & time 12/31/13  1411 History  This chart was scribed for Shaune Pollack, MD by Rolanda Lundborg, ED Scribe. This patient was seen in room APA12/APA12 and the patient's care was started at 2:15 PM.     Chief Complaint  Patient presents with  . Shortness of Breath    Patient is a 58 y.o. male presenting with shortness of breath. The history is provided by the patient. No language interpreter was used.  Shortness of Breath Severity:  Moderate Onset quality:  Gradual Duration:  30 hours Progression:  Worsening Chronicity:  Chronic Context: weather changes   Relieved by:  Nothing Worsened by:  Nothing tried Ineffective treatments:  None tried Associated symptoms: chest pain, cough and fever    HPI Comments: Andrew Estes is a 58 y.o. male with a h/o COPD who presents to the Emergency Department complaining of gradual-onset SOB onset 30 hours ago after getting out in the cold air. He states it feels like COPD exacerbations he has had in the past. Pt also reports right-sided CP since last night described as feeling like "theres an elephant sitting on my chest". He reports feeling weak and lightheaded. He reports he has not eaten in 2 days. He reports productive cough with yellow sputum that is baseline for him although wife reports she thinks it is getting worse. Wife reports subjective fever and chills. He does not have a PCP. He is not on any medications. He is a smoker. He does not drink alcohol. He is not on oxygen at home. He has never been on a ventilator. He has no allergies to medications.   Past Medical History  Diagnosis Date  . COPD (chronic obstructive pulmonary disease)   . Chronic kidney disease    Past Surgical History  Procedure Laterality Date  . Back surgery     Family History  Problem Relation Age of Onset  . Cancer Mother 72    pancreas  . Diverticulitis Mother   . Osteoarthritis Father    History  Substance Use Topics  . Smoking  status: Current Every Day Smoker -- 1.00 packs/day for 45 years    Types: Cigarettes  . Smokeless tobacco: Not on file  . Alcohol Use: No    Review of Systems  Constitutional: Positive for fever and chills.  Respiratory: Positive for cough and shortness of breath.   Cardiovascular: Positive for chest pain.  Neurological: Positive for weakness and light-headedness.  All other systems reviewed and are negative.    Allergies  Levaquin  Home Medications   Current Outpatient Rx  Name  Route  Sig  Dispense  Refill  . EXPIRED: albuterol (PROVENTIL HFA;VENTOLIN HFA) 108 (90 BASE) MCG/ACT inhaler   Inhalation   Inhale 2 puffs into the lungs every 4 (four) hours as needed for wheezing.   1 Inhaler   0   . azithromycin (ZITHROMAX) 250 MG tablet   Oral   Take 1 tablet (250 mg total) by mouth daily.   3 each   0   . EXPIRED: furosemide (LASIX) 20 MG tablet   Oral   Take 1 tablet (20 mg total) by mouth daily as needed (swelling).   10 tablet   0   . oxyCODONE-acetaminophen (PERCOCET) 10-650 MG per tablet   Oral   Take 1 tablet by mouth 2 (two) times daily.          BP 148/80  Pulse 90  Resp 25  SpO2 96% Physical Exam  Nursing note and vitals reviewed. Constitutional: He is oriented to person, place, and time. He appears well-developed and well-nourished. He appears distressed.  Morbidly obese   HENT:  Head: Normocephalic and atraumatic.  Right Ear: External ear normal.  Left Ear: External ear normal.  Nose: Nose normal.  Mucous membranes dry  Eyes: Conjunctivae and EOM are normal. Pupils are equal, round, and reactive to light.  Neck: Normal range of motion. Neck supple.  Cardiovascular: Normal rate, regular rhythm, normal heart sounds and intact distal pulses.   Pulmonary/Chest: Tachypnea noted. No respiratory distress. He has no wheezes. He exhibits no tenderness.  coarse breath sounds throughout  Abdominal: Soft. Bowel sounds are normal. He exhibits no  distension and no mass. There is no tenderness. There is no guarding.  Musculoskeletal: Normal range of motion.  rle with some increased size relative to lle  Neurological: He is alert and oriented to person, place, and time. He has normal reflexes. He exhibits normal muscle tone. Coordination normal.  Skin: Skin is warm and dry.  Skin is mottled.   Psychiatric: He has a normal mood and affect. His behavior is normal. Judgment and thought content normal.    ED Course  Procedures (including critical care time) Medications - No data to display  DIAGNOSTIC STUDIES: Oxygen Saturation is 96% on , normal by my interpretation.    COORDINATION OF CARE: 2:24 PM- Discussed treatment plan with pt. Pt agrees to plan.    Labs Review Labs Reviewed  COMPREHENSIVE METABOLIC PANEL - Abnormal; Notable for the following:    Glucose, Bld 100 (*)    Creatinine, Ser 1.83 (*)    Albumin 3.4 (*)    GFR calc non Af Amer 39 (*)    GFR calc Af Amer 46 (*)    All other components within normal limits  BLOOD GAS, ARTERIAL - Abnormal; Notable for the following:    pO2, Arterial 75.5 (*)    Bicarbonate 25.9 (*)    All other components within normal limits  CULTURE, BLOOD (ROUTINE X 2)  CULTURE, BLOOD (ROUTINE X 2)  CBC WITH DIFFERENTIAL  PRO B NATRIURETIC PEPTIDE  D-DIMER, QUANTITATIVE  INFLUENZA PANEL BY PCR (TYPE A & B, H1N1)   Imaging Review No results found.  EKG Interpretation   None      Date: 12/31/2013  Rate: 90  Rhythm: normal sinus rhythm  QRS Axis: left  Intervals: normal  ST/T Wave abnormalities: normal and nonspecific ST changes  Conduction Disutrbances:none  Narrative Interpretation:   Old EKG Reviewed: unchanged   MDM  No diagnosis found. 4:12 PM Patient continues with decreased air movement and tachypnea but maintaining oxygen saturation on nasal cannula.  Marland Kitchen  He complains of some upper abdominal discomfort from coughing.  Abdomen slightly ttp epigastrium and ruq.   States feels improved after albuterol and solumedrol.  Repeat neb ordered.  COPD exacerbation vs influenza in patient with copd and shortness of breath without respiratory failure.  Plan admission for further treatment. Influenza pcr and tamiflu ordered.   Care discussed with Dr. Tana Coast and temp admit orders placed for tele bed.  Droplet precautions ordered.   I personally performed the services described in this documentation, which was scribed in my presence. The recorded information has been reviewed and considered.   Shaune Pollack, MD 12/31/13 [phone]

## 2013-12-31 NOTE — Progress Notes (Signed)
Patient taught how to do flutter, done well

## 2013-12-31 NOTE — H&P (Signed)
History and Physical       Hospital Admission Note Date: 12/31/2013  Patient name: Andrew Estes Medical record number: [id number] Date of birth: [birth year] Age: 58 y.o. Gender: male  PCP: No PCP Per Patient    Chief Complaint:  Shortness of breath with wheezing since yesterday  HPI: Patient is a 58 year old male with history of COPD, active smoking, chronic back pain presented to the ER with shortness of breath and wheezing since yesterday. History was obtained from the patient reported that his symptoms were triggered after getting out in the cold air however he still has been having shortness of breath and orthopnea at night. He feels that his symptoms are similar to the prior episodes of COPD exacerbation. He does not have any nebulizers or inhalers at home. He reports that he does not have PCP as he was in the process of obtaining insurance. He is smoking almost a pack a day. He still reported subjective fevers and chills, cough with yellowish productive phlegm. Today he also has right-sided chest and epigastric pain worse with coughing. In the ER, chest x-ray was negative for any acute pneumonia. O2 sats are 96% on 2 L   Review of Systems:  Constitutional: + fever, chills, diaphoresis, poor appetite, has not been eating in the last 2 days and fatigue.  HEENT: Denies photophobia, eye pain, redness, hearing loss, ear pain, + congestion, sore throat, rhinorrhea, sneezing, mouth sores, trouble swallowing, neck pain, neck stiffness and tinnitus.   Respiratory: See history of present illness   Cardiovascular: Denies chest pain, palpitations, + peripheral edema  Gastrointestinal: Denies nausea, vomiting, abdominal pain, diarrhea, constipation, blood in stool and abdominal distention.  Genitourinary: Denies dysuria, urgency, frequency, hematuria, flank pain and difficulty urinating.  Musculoskeletal: Denies myalgias, + back pain, denies  joint swelling, arthralgias and gait problem. + chronic back pain and he had back surgery last year Skin: Denies pallor, rash and wound.  Neurological: Denies dizziness, seizures, syncope, weakness, light-headedness, numbness and headaches.  Hematological: Denies adenopathy. Easy bruising, personal or family bleeding history  Psychiatric/Behavioral: Denies suicidal ideation, mood changes, confusion, nervousness, sleep disturbance and agitation  Past Medical History: Past Medical History  Diagnosis Date  . COPD (chronic obstructive pulmonary disease)   . Chronic kidney disease    Past Surgical History  Procedure Laterality Date  . Back surgery      Medications: Prior to Admission medications   Medication Sig Start Date End Date Taking? Authorizing Provider  albuterol (PROVENTIL HFA;VENTOLIN HFA) 108 (90 BASE) MCG/ACT inhaler Inhale 2 puffs into the lungs every 4 (four) hours as needed for wheezing. 06/16/12 06/16/13  Delfina Redwood, MD  azithromycin (ZITHROMAX) 250 MG tablet Take 1 tablet (250 mg total) by mouth daily. 06/16/12   Delfina Redwood, MD  furosemide (LASIX) 20 MG tablet Take 1 tablet (20 mg total) by mouth daily as needed (swelling). 06/16/12 06/16/13  Delfina Redwood, MD  oxyCODONE-acetaminophen (PERCOCET) 10-650 MG per tablet Take 1 tablet by mouth 2 (two) times daily.    Historical Provider, MD    Allergies:   Allergies  Allergen Reactions  . Other Nausea And Vomiting    Tylenol 3  . Levaquin [Levofloxacin] Rash and Other (See Comments)    REACTION: ORAL rash (breakout)    Social History:  reports that he has been smoking Cigarettes.  He has a 45 pack-year smoking history. He does not have any smokeless tobacco history on file. He reports that he does not drink alcohol  or use illicit drugs.  Family History: Family History  Problem Relation Age of Onset  . Cancer Mother 79    pancreas  . Diverticulitis Mother   . Osteoarthritis Father     Physical  Exam: Blood pressure 135/92, pulse 97, resp. rate 30, SpO2 98.00%. General: Alert, awake, oriented x3, in mild respiratory distress. HEENT: normocephalic, atraumatic, anicteric sclera, pink conjunctiva, pupils equal and reactive to light and accomodation, oropharynx clear Neck: supple, no masses or lymphadenopathy, no goiter, no bruits  Heart: Regular rate and rhythm, without murmurs, rubs or gallops. Lungs:  diffuse rhonchi with expiratory wheezing bilaterally Abdomen: Soft, obese, positive bowel sounds, no masses. Extremities: No clubbing, cyanosis. 1+ edema with positive pedal pulses. Neuro: Grossly intact, no focal neurological deficits, strength 5/5 upper and lower extremities bilaterally Psych: alert and oriented x 3, normal mood and affect Skin: no rashes or lesions, warm and dry   LABS on Admission:  Basic Metabolic Panel:  Recent Labs Lab 12/31/13 1500  NA 141  K 4.6  CL 102  CO2 28  GLUCOSE 100*  BUN 17  CREATININE 1.83*  CALCIUM 8.8   Liver Function Tests:  Recent Labs Lab 12/31/13 1500  AST 18  ALT 19  ALKPHOS 86  BILITOT 0.4  PROT 6.9  ALBUMIN 3.4*   No results found for this basename: LIPASE, AMYLASE,  in the last 168 hours No results found for this basename: AMMONIA,  in the last 168 hours CBC:  Recent Labs Lab 12/31/13 1500  WBC 6.5  NEUTROABS 3.9  HGB 14.3  HCT 43.7  MCV 94.8  PLT 164   Cardiac Enzymes: No results found for this basename: CKTOTAL, CKMB, CKMBINDEX, TROPONINI,  in the last 168 hours BNP: No components found with this basename: POCBNP,  CBG: No results found for this basename: GLUCAP,  in the last 168 hours   Radiological Exams on Admission: Dg Chest Portable 1 View  12/31/2013   CLINICAL DATA:  Short of breath.  Cough.  Chills.  EXAM: PORTABLE CHEST - 1 VIEW  COMPARISON:  06/14/2012  FINDINGS: Heart, mediastinum and hila are unremarkable.  There are prominent vascular markings accentuated by the AP technique, patient  body habitus and semi-erect positioning. Vascular prominence is most evident on the right. There are no areas of lung consolidation. No pleural effusion or pneumothorax is seen.  The bony thorax is demineralized but grossly intact.  IMPRESSION: Vascular prominence without overt pulmonary edema. No focal consolidation to suggest pneumonia.   Electronically Signed   By: Lajean Manes M.D.   On: 12/31/2013 14:47    Assessment/Plan Principal Problem:   Acute respiratory failure secondary to COPD exacerbation - Placed on scheduled nebulizer treatment, O2, IV Zithromax and Rocephin, IV Solu-Medrol, Symbicort, flutter valve - Smoking cessation provided - Case management consult for home health needs for nebulizer machine - 2-D echo to rule out any underlying CHF, wall motion and EF  Active Problems:   Obesity - Patient counseled on diet and weight control    Tobacco abuse - Patient was counseled on smoking cessation    Chronic back pain - Continue pain control    Pedal edema - D-dimer within normal limits, patient reports chronic pedal edema, had right Doppler ultrasound which was negative for DVT in 2013 - check 2D echo  ? Acute kidney injury versus progression of chronic kidney disease:  Baseline creatinine 1.5 - Will recheck BMET in a.m., if worsening, will obtain renal ultrasound for further workup - Hold  Lasix for now  DVT prophylaxis: Lovenox  CODE STATUS: Full code  Family Communication: Admission, patients condition and plan of care including tests being ordered have been discussed with the patient and his wife who indicates understanding and agree with the plan and Code Status   Further plan will depend as patient's clinical course evolves and further radiologic and laboratory data become available.   Time Spent on Admission: 1 hour  RAI,RIPUDEEP M.D. [facility] 12/31/2013, 5:03 PM Pager: [phone]  If 7PM-7AM, please contact  night-coverage [email or web address] Password [email or web address]

## 2014-01-01 LAB — BASIC METABOLIC PANEL
BUN: 20 mg/dL (ref 6–23)
CALCIUM: 8.7 mg/dL (ref 8.4–10.5)
CO2: 26 mEq/L (ref 19–32)
Chloride: 97 mEq/L (ref 96–112)
Creatinine, Ser: 1.76 mg/dL — ABNORMAL HIGH (ref 0.50–1.35)
GFR calc Af Amer: 48 mL/min — ABNORMAL LOW (ref >90)
GFR, EST NON AFRICAN AMERICAN: 41 mL/min — AB (ref >90)
GLUCOSE: 206 mg/dL — AB (ref 70–99)
Potassium: 4.4 mEq/L (ref 3.7–5.3)
Sodium: 136 mEq/L — ABNORMAL LOW (ref 137–147)

## 2014-01-01 LAB — CBC
HCT: 42.6 % (ref 39.0–52.0)
HEMOGLOBIN: 14 g/dL (ref 13.0–17.0)
MCH: 30.9 pg (ref 26.0–34.0)
MCHC: 32.9 g/dL (ref 30.0–36.0)
MCV: 94 fL (ref 78.0–100.0)
PLATELETS: 157 10*3/uL (ref 150–400)
RBC: 4.53 MIL/uL (ref 4.22–5.81)
RDW: 13.7 % (ref 11.5–15.5)
WBC: 5.8 10*3/uL (ref 4.0–10.5)

## 2014-01-01 LAB — HEMOGLOBIN A1C
HEMOGLOBIN A1C: 5.4 % (ref <5.7)
MEAN PLASMA GLUCOSE: 108 mg/dL (ref <117)

## 2014-01-01 LAB — GLUCOSE, CAPILLARY
GLUCOSE-CAPILLARY: 154 mg/dL — AB (ref 70–99)
GLUCOSE-CAPILLARY: 142 mg/dL — AB (ref 70–99)
Glucose-Capillary: 205 mg/dL — ABNORMAL HIGH (ref 70–99)

## 2014-01-01 LAB — LIPID PANEL
CHOL/HDL RATIO: 4.4 ratio
Cholesterol: 186 mg/dL (ref 0–200)
HDL: 42 mg/dL (ref >39)
LDL Cholesterol: 132 mg/dL — ABNORMAL HIGH (ref 0–99)
TRIGLYCERIDES: 61 mg/dL (ref <150)
VLDL: 12 mg/dL (ref 0–40)

## 2014-01-01 LAB — TROPONIN I: Troponin I: <0.3 ng/mL (ref <0.30)

## 2014-01-01 LAB — TSH: TSH: 1.316 u[IU]/mL (ref 0.350–4.500)

## 2014-01-01 MED ORDER — INSULIN ASPART 100 UNIT/ML ~~LOC~~ SOLN
0.0000 [IU] | Freq: Every day | SUBCUTANEOUS | Status: DC
Start: 2014-01-01 — End: 2014-01-03

## 2014-01-01 MED ORDER — TEMAZEPAM 15 MG PO CAPS
15.0000 mg | ORAL_CAPSULE | Freq: Once | ORAL | Status: AC
  Administered 2014-01-01: 15 mg via ORAL
  Filled 2014-01-01: qty 1

## 2014-01-01 MED ORDER — HYDRALAZINE HCL 20 MG/ML IJ SOLN: 5.0000 mg | INTRAMUSCULAR | Status: DC | PRN

## 2014-01-01 MED ORDER — IPRATROPIUM-ALBUTEROL 0.5-2.5 (3) MG/3ML IN SOLN
3.0000 mL | Freq: Three times a day (TID) | RESPIRATORY_TRACT | Status: DC
  Administered 2014-01-01 – 2014-01-03 (×5): 3 mL via RESPIRATORY_TRACT
  Filled 2014-01-01 (×6): qty 3

## 2014-01-01 MED ORDER — METHYLPREDNISOLONE SODIUM SUCC 125 MG IJ SOLR
60.0000 mg | Freq: Two times a day (BID) | INTRAMUSCULAR | Status: DC
  Administered 2014-01-01: 60 mg via INTRAVENOUS
  Filled 2014-01-01 (×2): qty 2

## 2014-01-01 MED ORDER — INSULIN ASPART 100 UNIT/ML ~~LOC~~ SOLN
0.0000 [IU] | Freq: Three times a day (TID) | SUBCUTANEOUS | Status: DC
  Administered 2014-01-01: 3 [IU] via SUBCUTANEOUS
  Administered 2014-01-01: 5 [IU] via SUBCUTANEOUS
  Administered 2014-01-02 (×3): 2 [IU] via SUBCUTANEOUS

## 2014-01-01 NOTE — Care Management Note (Signed)
    Page 1 of 1   01/01/2014     11:32:54 AM   CARE MANAGEMENT NOTE 01/01/2014  Patient:  Andrew Estes, Andrew Estes   Account Number:  [id number]  Date Initiated:  01/01/2014  Documentation initiated by:  Theophilus Kinds  Subjective/Objective Assessment:   Pt admitted from home with COPD exacerbation. pt lives alone (estranged from wife) and is independent with ADL's.     Action/Plan:   Development worker, community to speak with pt about possible Medicaid (disability). Pt may need neb machine at discharge. Spoke with pt about what he could do to get part D coverage with Medicare. PCP appt made and documented on AVS.   Anticipated DC Date:  01/03/2014   Anticipated DC Plan:  [place]  CM consult      Choice offered to / List presented to:             Status of service:  Completed, signed off Medicare Important Message given?   (If response is "NO", the following Medicare IM given date fields will be blank) Date Medicare IM given:   Date Additional Medicare IM given:    Discharge Disposition:  HOME/SELF CARE  Per UR Regulation:    If discussed at Long Length of Stay Meetings, dates discussed:    Comments:  01/01/14 [place], RN BSN CM

## 2014-01-01 NOTE — Progress Notes (Signed)
UR chart review completed.  

## 2014-01-01 NOTE — Progress Notes (Signed)
[facility] PROGRESS NOTE  Andrew Estes YQI:[id number] DOB: February 19, 1956 DOA: 12/31/2013 PCP: No PCP Per Patient  Assessment/Plan: Principal Problem:  Acute respiratory failure secondary to COPD exacerbation  - Little improvement today. Will continue scheduled nebulizer treatment, O2, IV Zithromax and Rocephin, IV Solu-Medrol, Symbicort, flutter valve  - Smoking cessation provided  - Case management consult for home health needs for nebulizer machine  -  Await 2-D echo to rule out any underlying CHF, wall motion and EF   Active Problems:  Obesity  - Patient counseled on diet and weight control: very unhappy with heart healthy diet.   Tobacco abuse  - Patient was counseled on smoking cessation   Chronic back pain  - Continue pain control   Pedal edema  - D-dimer within normal limits, patient reports chronic pedal edema, had right Doppler ultrasound which was negative for DVT in 2013  - await 2D echo   ? Acute kidney injury versus progression of chronic kidney disease: Baseline creatinine 1.5  - creatinine trending down this am. Will continue to hold Lasix for now   Hyperglycemia: related to steroids. A1c pending. Will initiate SSI DVT prophylaxis: Lovenox  Code Status: full Family Communication: none present Disposition Plan: home when ready likely 24-48   Consultants:    Procedures:  Echo pending  Antibiotics:  Rocephin 12/31/13>>>  Zithromax 12/31/13>>  HPI/Subjective: Sitting on side of bed eating . Reports some improvement with breathing at rest but still sob with ambulation  Objective: Filed Vitals:   01/01/14 0630  BP: 132/77  Pulse: 92  Temp: 97.3 F (36.3 C)  Resp: 24    Intake/Output Summary (Last 24 hours) at 01/01/14 0915 Last data filed at 01/01/14 0850  Gross per 24 hour  Intake   1320 ml  Output    300 ml  Net   1020 ml   Filed Weights   12/31/13 1758  Weight: 145.5 kg (320 lb 12.3 oz)    Exam:   General:  Obese  NAD  Cardiovascular: RRR No MGR 1-2+ LE edema   Respiratory: mild increased work of breathing with conversation. BS diminished throughout. Poor air flow. Faint expiratory wheeze with underlying rhonchi particularly in base.   Abdomen: obese +BS non-tender   Musculoskeletal: no clubbing or cyanois   Data Reviewed: Basic Metabolic Panel:  Recent Labs Lab 12/31/13 1500 01/01/14 0415  NA 141 136*  K 4.6 4.4  CL 102 97  CO2 28 26  GLUCOSE 100* 206*  BUN 17 20  CREATININE 1.83* 1.76*  CALCIUM 8.8 8.7   Liver Function Tests:  Recent Labs Lab 12/31/13 1500  AST 18  ALT 19  ALKPHOS 86  BILITOT 0.4  PROT 6.9  ALBUMIN 3.4*   No results found for this basename: LIPASE, AMYLASE,  in the last 168 hours No results found for this basename: AMMONIA,  in the last 168 hours CBC:  Recent Labs Lab 12/31/13 1500 01/01/14 0327  WBC 6.5 5.8  NEUTROABS 3.9  --   HGB 14.3 14.0  HCT 43.7 42.6  MCV 94.8 94.0  PLT 164 157   Cardiac Enzymes:  Recent Labs Lab 12/31/13 1500 12/31/13 2118 01/01/14 0327  TROPONINI <0.30 <0.30 <0.30   BNP (last 3 results)  Recent Labs  12/31/13 1500  PROBNP 91.7   CBG: No results found for this basename: GLUCAP,  in the last 168 hours  No results found for this or any previous visit (from the past 240 hour(s)).   Studies: Dg  Chest Portable 1 View  12/31/2013   CLINICAL DATA:  Short of breath.  Cough.  Chills.  EXAM: PORTABLE CHEST - 1 VIEW  COMPARISON:  06/14/2012  FINDINGS: Heart, mediastinum and hila are unremarkable.  There are prominent vascular markings accentuated by the AP technique, patient body habitus and semi-erect positioning. Vascular prominence is most evident on the right. There are no areas of lung consolidation. No pleural effusion or pneumothorax is seen.  The bony thorax is demineralized but grossly intact.  IMPRESSION: Vascular prominence without overt pulmonary edema. No focal consolidation to suggest pneumonia.    Electronically Signed   By: Lajean Manes M.D.   On: 12/31/2013 14:47    Scheduled Meds: . azithromycin  500 mg Intravenous Q24H  . benzonatate  100 mg Oral Q8H  . budesonide-formoterol  2 puff Inhalation BID  . cefTRIAXone (ROCEPHIN)  IV  1 g Intravenous Q24H  . enoxaparin (LOVENOX) injection  40 mg Subcutaneous Q24H  . ipratropium-albuterol  3 mL Nebulization Q4H  . methylPREDNISolone (SOLU-MEDROL) injection  60 mg Intravenous Q6H  . nicotine  21 mg Transdermal Daily  . oxyCODONE-acetaminophen  1 tablet Oral BID   And  . oxyCODONE  5 mg Oral BID  . sodium chloride  3 mL Intravenous Q12H   Continuous Infusions:   Principal Problem:   COPD exacerbation Active Problems:   Obesity   Tobacco abuse   Chronic back pain   Acute respiratory failure   Pedal edema   Hyperlipidemia    Time spent: 30 minutes    [facility] Pager [phone]. If 7PM-7AM, please contact night-coverage at [email or web address], password [facility] 01/01/2014, 9:15 AM  LOS: 1 day     I have personally examined the patient and reviewed the entire database. Agree with the above note and plan as outlined by Ms [facility] , any necessary changes made. Will continue scheduled nebs, Symbicort Solu-Medrol taper to 60mg  Q12hrs, IV antibiotics. Await 2-D echocardiogram  Trevis Eden M.D. [facility] 01/01/2014, 11:09 AM Pager: [phone]  If 7PM-7AM, please contact night-coverage [email or web address] Password [email or web address]

## 2014-01-01 NOTE — Progress Notes (Signed)
Inpatient Diabetes Program Recommendations  AACE/ADA: New Consensus Statement on Inpatient Glycemic Control (2013)  Target Ranges:  Prepandial:   less than 140 mg/dL      Peak postprandial:   less than 180 mg/dL (1-2 hours)      Critically ill patients:  140 - 180 mg/dL   Results for ALIAS, VILLAGRAN (MRN [id number]) as of 01/01/2014 07:55  Ref. Range 12/31/2013 15:00 01/01/2014 04:15  Glucose Latest Range: 70-99 mg/dL 100 (H) 206 (H)    Diabetes history: NO Outpatient Diabetes medications: NA Current orders for Inpatient glycemic control: None  Inpatient Diabetes Program Recommendations Correction (SSI): Please order CBGs with Novolog correction scale ACHS while inpatient and on steroids. HgbA1C: Noted A1C has been ordered and pending. Diet: May want to consider adding Carb Modified to current Heart Healthy diet.  Note: Patient does not have a history of diabetes and initial blood glucose 100 mg/dl.  Patient is ordered steroids which is likely cause of elevated glucose.    Thanks, Barnie Alderman, RN, MSN, CCRN Diabetes Coordinator Inpatient Diabetes Program [phone] (Team Pager) [phone] (AP office) [phone] [place] office)

## 2014-01-02 DIAGNOSIS — R0601 Orthopnea: Secondary | ICD-10-CM

## 2014-01-02 LAB — BASIC METABOLIC PANEL
BUN: 30 mg/dL — ABNORMAL HIGH (ref 6–23)
CALCIUM: 9 mg/dL (ref 8.4–10.5)
CHLORIDE: 100 meq/L (ref 96–112)
CO2: 28 mEq/L (ref 19–32)
Creatinine, Ser: 1.56 mg/dL — ABNORMAL HIGH (ref 0.50–1.35)
GFR calc Af Amer: 55 mL/min — ABNORMAL LOW (ref >90)
GFR calc non Af Amer: 48 mL/min — ABNORMAL LOW (ref >90)
Glucose, Bld: 126 mg/dL — ABNORMAL HIGH (ref 70–99)
Potassium: 5 mEq/L (ref 3.7–5.3)
SODIUM: 140 meq/L (ref 137–147)

## 2014-01-02 LAB — D-DIMER, QUANTITATIVE: D-Dimer, Quant: <0.27 ug/mL-FEU (ref 0.00–0.48)

## 2014-01-02 LAB — GLUCOSE, CAPILLARY
GLUCOSE-CAPILLARY: 133 mg/dL — AB (ref 70–99)
Glucose-Capillary: 123 mg/dL — ABNORMAL HIGH (ref 70–99)
Glucose-Capillary: 124 mg/dL — ABNORMAL HIGH (ref 70–99)
Glucose-Capillary: 121 mg/dL — ABNORMAL HIGH (ref 70–99)

## 2014-01-02 MED ORDER — PREDNISONE 20 MG PO TABS
60.0000 mg | ORAL_TABLET | Freq: Every day | ORAL | Status: DC
  Administered 2014-01-02 – 2014-01-03 (×2): 60 mg via ORAL
  Filled 2014-01-02 (×2): qty 3

## 2014-01-02 MED ORDER — LORATADINE 10 MG PO TABS
10.0000 mg | ORAL_TABLET | Freq: Every day | ORAL | Status: DC
  Administered 2014-01-02 – 2014-01-03 (×2): 10 mg via ORAL
  Filled 2014-01-02 (×2): qty 1

## 2014-01-02 MED ORDER — ATORVASTATIN CALCIUM 10 MG PO TABS
10.0000 mg | ORAL_TABLET | Freq: Every day | ORAL | Status: DC
  Administered 2014-01-02: 10 mg via ORAL
  Filled 2014-01-02: qty 1

## 2014-01-02 MED ORDER — CEFUROXIME AXETIL 250 MG PO TABS
500.0000 mg | ORAL_TABLET | Freq: Two times a day (BID) | ORAL | Status: DC
  Administered 2014-01-02 – 2014-01-03 (×2): 500 mg via ORAL
  Filled 2014-01-02 (×2): qty 2

## 2014-01-02 MED ORDER — METHYLPREDNISOLONE SODIUM SUCC 40 MG IJ SOLR
40.0000 mg | Freq: Two times a day (BID) | INTRAMUSCULAR | Status: DC
  Administered 2014-01-02: 40 mg via INTRAVENOUS

## 2014-01-02 MED ORDER — AMLODIPINE BESYLATE 5 MG PO TABS
5.0000 mg | ORAL_TABLET | Freq: Every day | ORAL | Status: DC
  Administered 2014-01-02 – 2014-01-03 (×2): 5 mg via ORAL
  Filled 2014-01-02 (×2): qty 1

## 2014-01-02 NOTE — Progress Notes (Signed)
Pt's IV infiltrated this afternoon. MD made aware, she is agreeable to letting him keep IV out. Will continue to monitor.

## 2014-01-02 NOTE — Progress Notes (Signed)
At rest on 1L of O2 via [place] pt is sating 95%  At rest on RA pt is sating 95%  Sitting on RA pt is sating at 95%  Standing on RA pt is sating at 94%  With ambulation on RA pt is sating at 92%  Returning to rest on 1L of O2 via [place] pt is sating at 95%  Will continue to monitor

## 2014-01-02 NOTE — Progress Notes (Signed)
Attempted iv with no success. Patient refused to let me try a second time. Spoke with him about a picc placement patient refused. Stated he knew about piccs and he knew how uncomfortable they are did not pressure him on the issue could tell that patient was anxious.spoke with [place] black nurse practitioner stated she would speak with patient attending physcians.

## 2014-01-02 NOTE — Progress Notes (Signed)
*  PRELIMINARY RESULTS* Echocardiogram 2D Echocardiogram has been performed.  [place], [place] 01/02/2014, 10:08 AM

## 2014-01-02 NOTE — Progress Notes (Addendum)
[facility] PROGRESS NOTE  Andrew Estes YOV:[id number] DOB: Sep 11, 1956 DOA: 12/31/2013 PCP: No PCP Per Patient  Addendum  Patient's IV infiltrated, he refuses to have an IV placed and difficult stick, refuses tele  - Change to IV Solu-Medrol to prednisone 60 mg - DC'd IV antibiotics, change to oral Zithromax and oral Ceftin x1week - He will need nebulizer machine for the discharge - Ordered home O2 evaluation - Discussed with Dr. Harl Bowie for the echo results 2-D echo reviewed and explained to the patient, EF 74-12%, normal diastolic function - D-dimer pending  Possible DC in AM   Tamora Huneke M.D. [facility] 01/02/2014, 5:17 PM  Pager: [phone]     Assessment/Plan: Principal Problem:  Acute respiratory failure secondary to COPD exacerbation  - some improvement today. Will continue scheduled nebulizer treatment, O2, IV Zithromax and Rocephin (consider narrowing antibiotics), IV Solu-Medrol, Symbicort, flutter valve  - Smoking cessation provided  - Case management consult for home health needs for nebulizer machine  - Await 2-D echo to rule out any underlying CHF, wall motion and EF  Active Problems:  Obesity  - Patient counseled on diet and weight control: very unhappy with heart healthy diet.  Tobacco abuse  - Patient was counseled on smoking cessation  Chronic back pain  - Continue pain control  Pedal edema  - D-dimer within normal limits, patient reports chronic pedal edema, had right Doppler ultrasound which was negative for DVT in 2013  - await 2D echo  ? Acute kidney injury versus progression of chronic kidney disease: Baseline creatinine 1.5. Trending down today. Will continue to hold Lasix for now  Hyperglycemia: related to steroids. A1c 5.4. Will initiate SSI  Hyper lipidemia: LDL 132, patient reports that he does not eat any fatty foods, will place on Lipitor 10 mg daily  DVT prophylaxis: Lovenox    Code Status: full Family Communication:  none present Disposition Plan: home hopefully 24-48 hurs   Consultants:  none  Procedures:  none  Antibiotics: Rocephin 12/31/13>>>  Zithromax 12/31/13>>  HPI/Subjective: Sitting on side of bed. Complains of sinus congestion/drainage. Some little improvement in sob  Objective: Filed Vitals:   01/02/14 0643  BP: 133/88  Pulse: 92  Temp: 97.3 F (36.3 C)  Resp: 20    Intake/Output Summary (Last 24 hours) at 01/02/14 0852 Last data filed at 01/01/14 1300  Gross per 24 hour  Intake    240 ml  Output      0 ml  Net    240 ml   Filed Weights   12/31/13 1758  Weight: 145.5 kg (320 lb 12.3 oz)    Exam:   General:  Obese somewhat uncomfortable appearing  Cardiovascular: RRR No MGR 1+LE edema  Respiratory: mild increased work of breathing with conversation. Improved air movement. Less wheeze. Remains coarse sounding  Abdomen: obese soft +BS non-tender to palpation  Musculoskeletal: no clubbing or cyanosis   Data Reviewed: Basic Metabolic Panel:  Recent Labs Lab 12/31/13 1500 01/01/14 0415 01/02/14 0449  NA 141 136* 140  K 4.6 4.4 5.0  CL 102 97 100  CO2 28 26 28   GLUCOSE 100* 206* 126*  BUN 17 20 30*  CREATININE 1.83* 1.76* 1.56*  CALCIUM 8.8 8.7 9.0   Liver Function Tests:  Recent Labs Lab 12/31/13 1500  AST 18  ALT 19  ALKPHOS 86  BILITOT 0.4  PROT 6.9  ALBUMIN 3.4*   No results found for this basename: LIPASE, AMYLASE,  in the last 168 hours No  results found for this basename: AMMONIA,  in the last 168 hours CBC:  Recent Labs Lab 12/31/13 1500 01/01/14 0327  WBC 6.5 5.8  NEUTROABS 3.9  --   HGB 14.3 14.0  HCT 43.7 42.6  MCV 94.8 94.0  PLT 164 157   Cardiac Enzymes:  Recent Labs Lab 12/31/13 1500 12/31/13 2118 01/01/14 0327  TROPONINI <0.30 <0.30 <0.30   BNP (last 3 results)  Recent Labs  12/31/13 1500  PROBNP 91.7   CBG:  Recent Labs Lab 01/01/14 1122 01/01/14 1609 01/01/14 2042 01/02/14 0751  GLUCAP 205*  154* 142* 123*    Recent Results (from the past 240 hour(s))  CULTURE, BLOOD (ROUTINE X 2)     Status: None   Collection Time    12/31/13  3:05 PM      Result Value Range Status   Specimen Description BLOOD RIGHT ANTECUBITAL   Final   Special Requests     Final   Value: BOTTLES DRAWN AEROBIC AND ANAEROBIC AEB=7CC ANA=5CC   Culture NO GROWTH 1 DAY   Final   Report Status PENDING   Incomplete  CULTURE, BLOOD (ROUTINE X 2)     Status: None   Collection Time    12/31/13  3:20 PM      Result Value Range Status   Specimen Description BLOOD LEFT FOREARM   Final   Special Requests     Final   Value: BOTTLES DRAWN AEROBIC AND ANAEROBIC AEB=10CC ANA=6CC   Culture NO GROWTH 1 DAY   Final   Report Status PENDING   Incomplete     Studies: Dg Chest Portable 1 View  12/31/2013   CLINICAL DATA:  Short of breath.  Cough.  Chills.  EXAM: PORTABLE CHEST - 1 VIEW  COMPARISON:  06/14/2012  FINDINGS: Heart, mediastinum and hila are unremarkable.  There are prominent vascular markings accentuated by the AP technique, patient body habitus and semi-erect positioning. Vascular prominence is most evident on the right. There are no areas of lung consolidation. No pleural effusion or pneumothorax is seen.  The bony thorax is demineralized but grossly intact.  IMPRESSION: Vascular prominence without overt pulmonary edema. No focal consolidation to suggest pneumonia.   Electronically Signed   By: Lajean Manes M.D.   On: 12/31/2013 14:47    Scheduled Meds: . azithromycin  500 mg Intravenous Q24H  . benzonatate  100 mg Oral Q8H  . budesonide-formoterol  2 puff Inhalation BID  . cefTRIAXone (ROCEPHIN)  IV  1 g Intravenous Q24H  . enoxaparin (LOVENOX) injection  40 mg Subcutaneous Q24H  . insulin aspart  0-15 Units Subcutaneous TID WC  . insulin aspart  0-5 Units Subcutaneous QHS  . ipratropium-albuterol  3 mL Nebulization TID  . loratadine  10 mg Oral Daily  . methylPREDNISolone (SOLU-MEDROL) injection  60 mg  Intravenous Q12H  . nicotine  21 mg Transdermal Daily  . oxyCODONE-acetaminophen  1 tablet Oral BID   And  . oxyCODONE  5 mg Oral BID  . sodium chloride  3 mL Intravenous Q12H   Continuous Infusions:   Principal Problem:   COPD exacerbation Active Problems:   Obesity   Hyperglycemia   Tobacco abuse   Chronic back pain   Acute respiratory failure   Pedal edema   Hyperlipidemia    Time spent: 35 minutes    [facility] [facility] Pager [phone]. If 7PM-7AM, please contact night-coverage at [email or web address], password [facility] 01/02/2014, 8:52 AM  LOS: 2 days  I have personally examined the patient and reviewed the entire database. Agree with the above note and plan as outlined by Ms [facility], any necessary changes made. Patient is improving progressively, await 2-D echocardiogram results, rule out any underlying CHF, transition to oral prednisone in a.m. continue nebs, Antibiotics, possibly DC tomorrow if significantly improved.  Annelise Mccoy M.D. [facility] 01/02/2014, 10:53 AM Pager: [phone]  If 7PM-7AM, please contact night-coverage [email or web address] Password [email or web address]

## 2014-01-03 LAB — BASIC METABOLIC PANEL
BUN: 36 mg/dL — AB (ref 6–23)
CO2: 29 mEq/L (ref 19–32)
Calcium: 8.9 mg/dL (ref 8.4–10.5)
Chloride: 101 mEq/L (ref 96–112)
Creatinine, Ser: 1.55 mg/dL — ABNORMAL HIGH (ref 0.50–1.35)
GFR calc Af Amer: 56 mL/min — ABNORMAL LOW (ref >90)
GFR, EST NON AFRICAN AMERICAN: 48 mL/min — AB (ref >90)
Glucose, Bld: 111 mg/dL — ABNORMAL HIGH (ref 70–99)
POTASSIUM: 5.8 meq/L — AB (ref 3.7–5.3)
Sodium: 138 mEq/L (ref 137–147)

## 2014-01-03 LAB — GLUCOSE, CAPILLARY: Glucose-Capillary: 87 mg/dL (ref 70–99)

## 2014-01-03 LAB — POTASSIUM: POTASSIUM: 5.1 meq/L (ref 3.7–5.3)

## 2014-01-03 MED ORDER — ATORVASTATIN CALCIUM 10 MG PO TABS: 10.0000 mg | ORAL_TABLET | Freq: Every day | ORAL | Status: DC

## 2014-01-03 MED ORDER — SODIUM POLYSTYRENE SULFONATE 15 GM/60ML PO SUSP: 15.0000 g | Freq: Once | ORAL | Status: DC

## 2014-01-03 MED ORDER — GLIPIZIDE 5 MG PO TABS: 5.0000 mg | ORAL_TABLET | Freq: Every day | ORAL | Status: DC

## 2014-01-03 MED ORDER — AZITHROMYCIN 500 MG PO TABS: 500.0000 mg | ORAL_TABLET | Freq: Every day | ORAL | Status: DC

## 2014-01-03 MED ORDER — AMLODIPINE BESYLATE 5 MG PO TABS: 5.0000 mg | ORAL_TABLET | Freq: Every day | ORAL | Status: DC

## 2014-01-03 MED ORDER — CEFDINIR 300 MG PO CAPS: 300.0000 mg | ORAL_CAPSULE | Freq: Two times a day (BID) | ORAL | Status: DC

## 2014-01-03 MED ORDER — BENZONATATE 100 MG PO CAPS: 100.0000 mg | ORAL_CAPSULE | Freq: Three times a day (TID) | ORAL | Status: DC

## 2014-01-03 MED ORDER — PREDNISONE 20 MG PO TABS: 60.0000 mg | ORAL_TABLET | Freq: Every day | ORAL | Status: DC

## 2014-01-03 MED ORDER — NICOTINE 21 MG/24HR TD PT24: 21.0000 mg | MEDICATED_PATCH | Freq: Every day | TRANSDERMAL | Status: DC

## 2014-01-03 MED ORDER — BUDESONIDE-FORMOTEROL FUMARATE 80-4.5 MCG/ACT IN AERO: 2.0000 | INHALATION_SPRAY | Freq: Two times a day (BID) | RESPIRATORY_TRACT | Status: DC

## 2014-01-03 NOTE — Progress Notes (Signed)
Pt discharged with instructions and prescriptions.  The patient and wife verbalized understanding.  The patient left the floor via w/c in stable condition with staff and family.

## 2014-01-03 NOTE — Discharge Summary (Signed)
Physician Discharge Summary  Andrew Estes KZS:[id number] DOB: June 05, 1956 DOA: 12/31/2013  PCP: No PCP Per Patient  Admit date: 12/31/2013 Discharge date: 01/03/2014  Time spent: > 35 minutes  Recommendations for Outpatient Follow-up:  Please be sure to reassess potassium levels Also reassess serum creatinine levels Continue to monitor patient's blood sugars and adjust hypoglycemic agents pending blood sugar values Continue to encourage tobacco cessation  Discharge Diagnoses:  Principal Problem:   COPD exacerbation Active Problems:   Obesity   Hyperglycemia   Tobacco abuse   Chronic back pain   Acute respiratory failure   Pedal edema   Hyperlipidemia   Discharge Condition: Stable  Diet recommendation: Diabetic diet  Filed Weights   12/31/13 1758  Weight: 145.5 kg (320 lb 12.3 oz)    History of present illness:  58 year old with history of COPD, current smoker, chronic back pain who presented to the ED complaining of shortness of breath.  Hospital Course:  Acute COPD exacerbation. - Discharge home on prednisone, oral antibiotics, bronchodilators, Symbicort - Recommended tobacco cessation  Chronic back pain - Have recommended patient followup with his primary care physician who is managing his chronic back pain  Tobacco abuse - Recommend cessation and will provide prescription for nicotine patch  Hyperglycemia - Provide a prescription for glucose meter with lancets and strips - Recommend diabetic diet on discharge - Patient to check blood sugars at least 2 times per day - Provide glipizide given elevated serum creatinine levels patient is not candidate for metformin  Hyperkalemia - Resolved without intervention may have been lab error as repeat blood test showed normal potassium levels.  Procedures:  None  Consultations:  None  Discharge Exam: Filed Vitals:   01/03/14 0605  BP: 134/90  Pulse: 76  Temp: 97.2 F (36.2 C)  Resp: 20    General: Pt  in NAD, alert and awake Cardiovascular: RRR, no MRG Respiratory: CTA BL, no wheezes  Discharge Instructions  Discharge Orders   Future Orders Complete By Expires   Call MD for:  difficulty breathing, headache or visual disturbances  As directed    Call MD for:  temperature >100.4  As directed    Diet - low sodium heart healthy  As directed    Discharge instructions  As directed    Comments:     Avoid smoking tobacco.  Followup with your primary care physician within the next one to 2 weeks or sooner should any new concerns arise.   Increase activity slowly  As directed        Medication List    STOP taking these medications       ALEVE 220 MG tablet  Generic drug:  naproxen sodium      TAKE these medications       amLODipine 5 MG tablet  Commonly known as:  NORVASC  Take 1 tablet (5 mg total) by mouth daily.     atorvastatin 10 MG tablet  Commonly known as:  LIPITOR  Take 1 tablet (10 mg total) by mouth daily at 6 PM.     benzonatate 100 MG capsule  Commonly known as:  TESSALON  Take 1 capsule (100 mg total) by mouth every 8 (eight) hours.     budesonide-formoterol 80-4.5 MCG/ACT inhaler  Commonly known as:  SYMBICORT  Inhale 2 puffs into the lungs 2 (two) times daily.     diphenhydrAMINE 25 MG tablet  Commonly known as:  BENADRYL  Take 25 mg by mouth every 6 (six) hours as  needed for allergies.     glipiZIDE 5 MG tablet  Commonly known as:  GLUCOTROL  Take 1 tablet (5 mg total) by mouth daily before breakfast.     nicotine 21 mg/24hr patch  Commonly known as:  NICODERM CQ - dosed in mg/24 hours  Place 1 patch (21 mg total) onto the skin daily.     predniSONE 20 MG tablet  Commonly known as:  DELTASONE  Take 3 tablets (60 mg total) by mouth daily with breakfast.       Allergies  Allergen Reactions  . Tylenol With Codeine #3 [Acetaminophen-Codeine] Nausea And Vomiting  . Levaquin [Levofloxacin] Rash and Other (See Comments)    REACTION: ORAL rash  (breakout)       Follow-up Information   Follow up On 01/29/2014. (at 2:30)    Contact information:   [facility] [phone]       The results of significant diagnostics from this hospitalization (including imaging, microbiology, ancillary and laboratory) are listed below for reference.    Significant Diagnostic Studies: Dg Chest Portable 1 View  12/31/2013   CLINICAL DATA:  Short of breath.  Cough.  Chills.  EXAM: PORTABLE CHEST - 1 VIEW  COMPARISON:  06/14/2012  FINDINGS: Heart, mediastinum and hila are unremarkable.  There are prominent vascular markings accentuated by the AP technique, patient body habitus and semi-erect positioning. Vascular prominence is most evident on the right. There are no areas of lung consolidation. No pleural effusion or pneumothorax is seen.  The bony thorax is demineralized but grossly intact.  IMPRESSION: Vascular prominence without overt pulmonary edema. No focal consolidation to suggest pneumonia.   Electronically Signed   By: Lajean Manes M.D.   On: 12/31/2013 14:47    Microbiology: Recent Results (from the past 240 hour(s))  CULTURE, BLOOD (ROUTINE X 2)     Status: None   Collection Time    12/31/13  3:05 PM      Result Value Range Status   Specimen Description BLOOD RIGHT ANTECUBITAL   Final   Special Requests     Final   Value: BOTTLES DRAWN AEROBIC AND ANAEROBIC AEB=7CC ANA=5CC   Culture NO GROWTH 1 DAY   Final   Report Status PENDING   Incomplete  CULTURE, BLOOD (ROUTINE X 2)     Status: None   Collection Time    12/31/13  3:20 PM      Result Value Range Status   Specimen Description BLOOD LEFT FOREARM   Final   Special Requests     Final   Value: BOTTLES DRAWN AEROBIC AND ANAEROBIC AEB=10CC ANA=6CC   Culture NO GROWTH 1 DAY   Final   Report Status PENDING   Incomplete     Labs: Basic Metabolic Panel:  Recent Labs Lab 12/31/13 1500 01/01/14 0415 01/02/14 0449 01/03/14 0548 01/03/14 1037  NA 141 136* 140 138   --   K 4.6 4.4 5.0 5.8* 5.1  CL 102 97 100 101  --   CO2 28 26 28 29   --   GLUCOSE 100* 206* 126* 111*  --   BUN 17 20 30* 36*  --   CREATININE 1.83* 1.76* 1.56* 1.55*  --   CALCIUM 8.8 8.7 9.0 8.9  --    Liver Function Tests:  Recent Labs Lab 12/31/13 1500  AST 18  ALT 19  ALKPHOS 86  BILITOT 0.4  PROT 6.9  ALBUMIN 3.4*   No results found for this basename: LIPASE, AMYLASE,  in the last 168 hours No results found for this basename: AMMONIA,  in the last 168 hours CBC:  Recent Labs Lab 12/31/13 1500 01/01/14 0327  WBC 6.5 5.8  NEUTROABS 3.9  --   HGB 14.3 14.0  HCT 43.7 42.6  MCV 94.8 94.0  PLT 164 157   Cardiac Enzymes:  Recent Labs Lab 12/31/13 1500 12/31/13 2118 01/01/14 0327  TROPONINI <0.30 <0.30 <0.30   BNP: BNP (last 3 results)  Recent Labs  12/31/13 1500  PROBNP 91.7   CBG:  Recent Labs Lab 01/02/14 0751 01/02/14 1154 01/02/14 1721 01/02/14 2113 01/03/14 0713  GLUCAP 123* 133* 124* 121* 87       Signed:  Velvet Bathe  [facility] 01/03/2014, 11:17 AM

## 2014-01-05 LAB — CULTURE, BLOOD (ROUTINE X 2)
CULTURE: NO GROWTH
Culture: NO GROWTH

## 2014-11-19 ENCOUNTER — Encounter (HOSPITAL_COMMUNITY): Payer: Self-pay

## 2014-11-19 ENCOUNTER — Emergency Department (HOSPITAL_COMMUNITY): Payer: Medicare Other

## 2014-11-19 ENCOUNTER — Emergency Department (HOSPITAL_COMMUNITY)
Admission: EM | Admit: 2014-11-19 | Discharge: 2014-11-19 | Disposition: A | Discharge status: Home / Self Care | Payer: Medicare Other | Attending: Emergency Medicine | Admitting: Emergency Medicine

## 2014-11-19 DIAGNOSIS — L97919 Non-pressure chronic ulcer of unspecified part of right lower leg with unspecified severity: Secondary | ICD-10-CM

## 2014-11-19 DIAGNOSIS — Z792 Long term (current) use of antibiotics: Secondary | ICD-10-CM | POA: Diagnosis not present

## 2014-11-19 DIAGNOSIS — Z79899 Other long term (current) drug therapy: Secondary | ICD-10-CM | POA: Diagnosis not present

## 2014-11-19 DIAGNOSIS — Z791 Long term (current) use of non-steroidal anti-inflammatories (NSAID): Secondary | ICD-10-CM | POA: Insufficient documentation

## 2014-11-19 DIAGNOSIS — I83029 Varicose veins of left lower extremity with ulcer of unspecified site: Secondary | ICD-10-CM

## 2014-11-19 DIAGNOSIS — L97912 Non-pressure chronic ulcer of unspecified part of right lower leg with fat layer exposed: Secondary | ICD-10-CM | POA: Diagnosis not present

## 2014-11-19 DIAGNOSIS — I83019 Varicose veins of right lower extremity with ulcer of unspecified site: Secondary | ICD-10-CM

## 2014-11-19 DIAGNOSIS — N189 Chronic kidney disease, unspecified: Secondary | ICD-10-CM | POA: Diagnosis not present

## 2014-11-19 DIAGNOSIS — L97929 Non-pressure chronic ulcer of unspecified part of left lower leg with unspecified severity: Secondary | ICD-10-CM

## 2014-11-19 DIAGNOSIS — R0602 Shortness of breath: Secondary | ICD-10-CM

## 2014-11-19 DIAGNOSIS — Z7951 Long term (current) use of inhaled steroids: Secondary | ICD-10-CM | POA: Insufficient documentation

## 2014-11-19 DIAGNOSIS — J441 Chronic obstructive pulmonary disease with (acute) exacerbation: Secondary | ICD-10-CM | POA: Diagnosis not present

## 2014-11-19 DIAGNOSIS — Z7952 Long term (current) use of systemic steroids: Secondary | ICD-10-CM | POA: Diagnosis not present

## 2014-11-19 DIAGNOSIS — Z720 Tobacco use: Secondary | ICD-10-CM | POA: Insufficient documentation

## 2014-11-19 DIAGNOSIS — L97911 Non-pressure chronic ulcer of unspecified part of right lower leg limited to breakdown of skin: Secondary | ICD-10-CM | POA: Diagnosis not present

## 2014-11-19 DIAGNOSIS — I83008 Varicose veins of unspecified lower extremity with ulcer other part of lower leg: Secondary | ICD-10-CM | POA: Diagnosis not present

## 2014-11-19 LAB — BASIC METABOLIC PANEL
ANION GAP: 12 (ref 5–15)
BUN: 25 mg/dL — ABNORMAL HIGH (ref 6–23)
CO2: 25 meq/L (ref 19–32)
Calcium: 9 mg/dL (ref 8.4–10.5)
Chloride: 106 mEq/L (ref 96–112)
Creatinine, Ser: 1.68 mg/dL — ABNORMAL HIGH (ref 0.50–1.35)
GFR calc Af Amer: 50 mL/min — ABNORMAL LOW (ref >90)
GFR calc non Af Amer: 43 mL/min — ABNORMAL LOW (ref >90)
Glucose, Bld: 138 mg/dL — ABNORMAL HIGH (ref 70–99)
POTASSIUM: 5.2 meq/L (ref 3.7–5.3)
SODIUM: 143 meq/L (ref 137–147)

## 2014-11-19 LAB — CBC WITH DIFFERENTIAL/PLATELET
Basophils Absolute: 0.1 10*3/uL (ref 0.0–0.1)
Basophils Relative: 1 % (ref 0–1)
EOS PCT: 1 % (ref 0–5)
Eosinophils Absolute: 0.1 10*3/uL (ref 0.0–0.7)
HCT: 43.4 % (ref 39.0–52.0)
Hemoglobin: 14.1 g/dL (ref 13.0–17.0)
LYMPHS ABS: 1.8 10*3/uL (ref 0.7–4.0)
LYMPHS PCT: 17 % (ref 12–46)
MCH: 31 pg (ref 26.0–34.0)
MCHC: 32.5 g/dL (ref 30.0–36.0)
MCV: 95.4 fL (ref 78.0–100.0)
Monocytes Absolute: 0.7 10*3/uL (ref 0.1–1.0)
Monocytes Relative: 7 % (ref 3–12)
NEUTROS ABS: 7.8 10*3/uL — AB (ref 1.7–7.7)
NEUTROS PCT: 74 % (ref 43–77)
PLATELETS: 181 10*3/uL (ref 150–400)
RBC: 4.55 MIL/uL (ref 4.22–5.81)
RDW: 13 % (ref 11.5–15.5)
WBC: 10.4 10*3/uL (ref 4.0–10.5)

## 2014-11-19 LAB — BLOOD GAS, VENOUS
Acid-Base Excess: 1.7 mmol/L (ref 0.0–2.0)
Bicarbonate: 26.6 mEq/L — ABNORMAL HIGH (ref 20.0–24.0)
DRAWN BY: 338401
FIO2: 0.28 %
O2 Saturation: 78.7 %
PCO2 VEN: 47.9 mmHg (ref 45.0–50.0)
PH VEN: 7.363 — AB (ref 7.250–7.300)
Patient temperature: 37
TCO2: 23.7 mmol/L (ref 0–100)
pO2, Ven: 41.4 mmHg (ref 30.0–45.0)

## 2014-11-19 MED ORDER — ALBUTEROL SULFATE (2.5 MG/3ML) 0.083% IN NEBU
2.5000 mg | INHALATION_SOLUTION | Freq: Once | RESPIRATORY_TRACT | Status: AC
  Administered 2014-11-19: 2.5 mg via RESPIRATORY_TRACT

## 2014-11-19 MED ORDER — AZITHROMYCIN 250 MG PO TABS: ORAL_TABLET | ORAL | Status: DC

## 2014-11-19 MED ORDER — IPRATROPIUM-ALBUTEROL 0.5-2.5 (3) MG/3ML IN SOLN
3.0000 mL | Freq: Once | RESPIRATORY_TRACT | Status: AC
  Administered 2014-11-19: 3 mL via RESPIRATORY_TRACT

## 2014-11-19 MED ORDER — IPRATROPIUM-ALBUTEROL 0.5-2.5 (3) MG/3ML IN SOLN
RESPIRATORY_TRACT | Status: AC
  Filled 2014-11-19: qty 3

## 2014-11-19 MED ORDER — METHYLPREDNISOLONE SODIUM SUCC 125 MG IJ SOLR
125.0000 mg | Freq: Once | INTRAMUSCULAR | Status: AC
  Administered 2014-11-19: 125 mg via INTRAMUSCULAR
  Filled 2014-11-19: qty 2

## 2014-11-19 MED ORDER — BUDESONIDE-FORMOTEROL FUMARATE 160-4.5 MCG/ACT IN AERO
2.0000 | INHALATION_SPRAY | Freq: Two times a day (BID) | RESPIRATORY_TRACT | Status: DC
Start: 2014-11-19 — End: 2014-11-19
  Administered 2014-11-19: 2 via RESPIRATORY_TRACT
  Filled 2014-11-19: qty 6

## 2014-11-19 MED ORDER — ALBUTEROL SULFATE HFA 108 (90 BASE) MCG/ACT IN AERS
2.0000 | INHALATION_SPRAY | RESPIRATORY_TRACT | Status: DC | PRN
  Administered 2014-11-19: 2 via RESPIRATORY_TRACT
  Filled 2014-11-19: qty 6.7

## 2014-11-19 MED ORDER — ALBUTEROL SULFATE (2.5 MG/3ML) 0.083% IN NEBU
INHALATION_SOLUTION | RESPIRATORY_TRACT | Status: AC
  Filled 2014-11-19: qty 3

## 2014-11-19 MED ORDER — IPRATROPIUM-ALBUTEROL 0.5-2.5 (3) MG/3ML IN SOLN
3.0000 mL | Freq: Once | RESPIRATORY_TRACT | Status: AC
  Administered 2014-11-19: 3 mL via RESPIRATORY_TRACT
  Filled 2014-11-19: qty 3

## 2014-11-19 MED ORDER — ALBUTEROL SULFATE (2.5 MG/3ML) 0.083% IN NEBU
2.5000 mg | INHALATION_SOLUTION | Freq: Once | RESPIRATORY_TRACT | Status: AC
  Administered 2014-11-19: 2.5 mg via RESPIRATORY_TRACT
  Filled 2014-11-19: qty 3

## 2014-11-19 MED ORDER — BUDESONIDE-FORMOTEROL FUMARATE 160-4.5 MCG/ACT IN AERO
INHALATION_SPRAY | RESPIRATORY_TRACT | Status: AC
  Filled 2014-11-19: qty 6

## 2014-11-19 NOTE — ED Notes (Signed)
Pt reports worsening sob over the past several days, denies cp

## 2014-11-19 NOTE — ED Provider Notes (Signed)
CSN: [id number]     Arrival date & time 11/19/14  0348 History   First MD Initiated Contact with Patient 11/19/14 0404     Chief Complaint  Patient presents with  . Shortness of Breath     (Consider location/radiation/quality/duration/timing/severity/associated sxs/prior Treatment) HPI This is a 58 year old male with COPD. He is here with shortness of breath and wheezing that have worsened over the past several days and is now moderate to severe. Is gotten to the point where he cannot lie flat. His symptoms are also worse with exertion. He denies chest pain. He denies fever or chills. He has had cough productive of sputum. He has also developed edema of the lower legs with ulcerations of the shins bilaterally. He states that these remain intermittently very painful, especially when walking. The ulcerations are weeping. He compares the pain to that of shingles.  He states that part of his problem is that he does not have the funds to go to the doctor or to get his prescriptions refilled. He does not have an albuterol inhaler, and has not had one for months.  Past Medical History  Diagnosis Date  . COPD (chronic obstructive pulmonary disease)   . Chronic kidney disease    Past Surgical History  Procedure Laterality Date  . Back surgery     Family History  Problem Relation Age of Onset  . Diverticulitis Mother   . Cancer Mother 68    pancreas  . Osteoarthritis Father    History  Substance Use Topics  . Smoking status: Current Every Day Smoker -- 1.00 packs/day for 45 years    Types: Cigarettes  . Smokeless tobacco: Never Used  . Alcohol Use: No    Review of Systems  All other systems reviewed and are negative.   Allergies  Tylenol with codeine #3 and Levaquin  Home Medications   Prior to Admission medications   Medication Sig Start Date End Date Taking? Authorizing Provider  amLODipine (NORVASC) 5 MG tablet Take 1 tablet (5 mg total) by mouth daily. 01/03/14   Velvet Bathe, MD  atorvastatin (LIPITOR) 10 MG tablet Take 1 tablet (10 mg total) by mouth daily at 6 PM. 01/03/14   Velvet Bathe, MD  azithromycin (ZITHROMAX) 500 MG tablet Take 1 tablet (500 mg total) by mouth daily. 01/03/14   Velvet Bathe, MD  benzonatate (TESSALON) 100 MG capsule Take 1 capsule (100 mg total) by mouth every 8 (eight) hours. 01/03/14   Velvet Bathe, MD  budesonide-formoterol (SYMBICORT) 80-4.5 MCG/ACT inhaler Inhale 2 puffs into the lungs 2 (two) times daily. 01/03/14   Velvet Bathe, MD  cefdinir (OMNICEF) 300 MG capsule Take 1 capsule (300 mg total) by mouth 2 (two) times daily. 01/03/14   Velvet Bathe, MD  diphenhydrAMINE (BENADRYL) 25 MG tablet Take 25 mg by mouth every 6 (six) hours as needed for allergies.    Historical Provider, MD  glipiZIDE (GLUCOTROL) 5 MG tablet Take 1 tablet (5 mg total) by mouth daily before breakfast. 01/03/14   Velvet Bathe, MD  nicotine (NICODERM CQ - DOSED IN MG/24 HOURS) 21 mg/24hr patch Place 1 patch (21 mg total) onto the skin daily. 01/03/14   Velvet Bathe, MD  predniSONE (DELTASONE) 20 MG tablet Take 3 tablets (60 mg total) by mouth daily with breakfast. 01/03/14   Velvet Bathe, MD   BP 124/72 mmHg  Pulse 96  Temp(Src) 97.6 F (36.4 C) (Oral)  Resp 22  Ht 6' (1.829 m)  Wt 325 lb (147.419  kg)  BMI 44.07 kg/m2  SpO2 97%   Physical Exam  General: Well-developed, obese male in no acute distress; appearance consistent with age of record HENT: normocephalic; atraumatic Eyes: pupils equal, round and reactive to light; extraocular muscles intact Neck: supple Heart: regular rate and rhythm Lungs: Tachypnea; expiratory wheezes Abdomen: soft; obese; nontender; bowel sounds present Extremities: No deformity; full range of motion; edema of lower legs with ulcers bilaterally as shown:   Neurologic: Awake, alert and oriented; motor function intact in all extremities and symmetric; no facial droop Skin: Warm and dry; lower extremity ulcers as shown  above Psychiatric: Normal mood and affect    ED Course  Procedures (including critical care time)   EKG Interpretation   Date/Time:  Thursday November 19 2014 03:58:00 EST Ventricular Rate:  90 PR Interval:  168 QRS Duration: 109 QT Interval:  362 QTC Calculation: 443 R Axis:   -150 Text Interpretation:  Sinus rhythm Markedly posterior QRS axis Low  voltage, precordial leads Consider anterior infarct No significant change  was found Confirmed by Kemi Gell  MD, Jenny Reichmann ([id number]) on 11/19/2014 4:01:29 AM      MDM  Nursing notes and vitals signs, including pulse oximetry, reviewed.  Summary of this visit's results, reviewed by myself:  Labs:  Results for orders placed or performed during the hospital encounter of 11/19/14 (from the past 24 hour(s))  CBC with Differential     Status: Abnormal   Collection Time: 11/19/14  4:30 AM  Result Value Ref Range   WBC 10.4 4.0 - 10.5 K/uL   RBC 4.55 4.22 - 5.81 MIL/uL   Hemoglobin 14.1 13.0 - 17.0 g/dL   HCT 43.4 39.0 - 52.0 %   MCV 95.4 78.0 - 100.0 fL   MCH 31.0 26.0 - 34.0 pg   MCHC 32.5 30.0 - 36.0 g/dL   RDW 13.0 11.5 - 15.5 %   Platelets 181 150 - 400 K/uL   Neutrophils Relative % 74 43 - 77 %   Neutro Abs 7.8 (H) 1.7 - 7.7 K/uL   Lymphocytes Relative 17 12 - 46 %   Lymphs Abs 1.8 0.7 - 4.0 K/uL   Monocytes Relative 7 3 - 12 %   Monocytes Absolute 0.7 0.1 - 1.0 K/uL   Eosinophils Relative 1 0 - 5 %   Eosinophils Absolute 0.1 0.0 - 0.7 K/uL   Basophils Relative 1 0 - 1 %   Basophils Absolute 0.1 0.0 - 0.1 K/uL  Basic metabolic panel     Status: Abnormal   Collection Time: 11/19/14  4:30 AM  Result Value Ref Range   Sodium 143 137 - 147 mEq/L   Potassium 5.2 3.7 - 5.3 mEq/L   Chloride 106 96 - 112 mEq/L   CO2 25 19 - 32 mEq/L   Glucose, Bld 138 (H) 70 - 99 mg/dL   BUN 25 (H) 6 - 23 mg/dL   Creatinine, Ser 1.68 (H) 0.50 - 1.35 mg/dL   Calcium 9.0 8.4 - 10.5 mg/dL   GFR calc non Af Amer 43 (L) >90 mL/min   GFR calc Af  Amer 50 (L) >90 mL/min   Anion gap 12 5 - 15  Blood gas, venous     Status: Abnormal   Collection Time: 11/19/14  4:32 AM  Result Value Ref Range   FIO2 0.28 %   Delivery systems NASAL CANNULA    pH, Ven 7.363 (H) 7.250 - 7.300   pCO2, Ven 47.9 45.0 - 50.0 mmHg  pO2, Ven 41.4 30.0 - 45.0 mmHg   Bicarbonate 26.6 (H) 20.0 - 24.0 mEq/L   TCO2 23.7 0 - 100 mmol/L   Acid-Base Excess 1.7 0.0 - 2.0 mmol/L   O2 Saturation 78.7 %   Patient temperature 37.0    Collection site VEIN    Drawn by [id number]    Sample type VENOUS     Imaging Studies: Dg Chest 2 View  11/19/2014   CLINICAL DATA:  Shortness of breath.  COPD  EXAM: CHEST  2 VIEW  COMPARISON:  12/31/2013  FINDINGS: Borderline cardiomegaly which is stable. Mildly aortic tortuosity is also unchanged. There is chronic bronchitic markings and pulmonary hyperinflation with mild scarring or atelectasis in the lingula. There is a 9 mm nodular density overlapping the right mid chest, too high to represent a normal nipple shadow.  IMPRESSION: 1. COPD without acute superimposed finding. 2. Possible 9 mm nodule in the right lung. Recommend outpatient chest CT follow-up (contrast not essential).   Electronically Signed   By: Jorje Guild M.D.   On: 11/19/2014 04:59   5:23 AM Wheezing resolved after neb treatment 2. Patient still somewhat tachypneic, but his O2 saturation is 97% on room air. We will refill his albuterol inhaler and provided an inhaled steroid. We will also address his ulcers and teach him how to change the dressings.   Wynetta Fines, MD 11/19/14 [phone]

## 2014-11-24 ENCOUNTER — Encounter (HOSPITAL_COMMUNITY): Payer: Self-pay | Admitting: Emergency Medicine

## 2014-11-24 ENCOUNTER — Emergency Department (HOSPITAL_COMMUNITY)
Admission: EM | Admit: 2014-11-24 | Discharge: 2014-11-24 | Disposition: A | Discharge status: Home / Self Care | Payer: Medicare Other | Attending: Emergency Medicine | Admitting: Emergency Medicine

## 2014-11-24 DIAGNOSIS — N189 Chronic kidney disease, unspecified: Secondary | ICD-10-CM | POA: Insufficient documentation

## 2014-11-24 DIAGNOSIS — Z792 Long term (current) use of antibiotics: Secondary | ICD-10-CM | POA: Diagnosis not present

## 2014-11-24 DIAGNOSIS — L03116 Cellulitis of left lower limb: Secondary | ICD-10-CM | POA: Diagnosis not present

## 2014-11-24 DIAGNOSIS — J449 Chronic obstructive pulmonary disease, unspecified: Secondary | ICD-10-CM | POA: Diagnosis not present

## 2014-11-24 DIAGNOSIS — L03115 Cellulitis of right lower limb: Secondary | ICD-10-CM | POA: Diagnosis not present

## 2014-11-24 DIAGNOSIS — Z720 Tobacco use: Secondary | ICD-10-CM | POA: Diagnosis not present

## 2014-11-24 DIAGNOSIS — Z4801 Encounter for change or removal of surgical wound dressing: Secondary | ICD-10-CM | POA: Diagnosis present

## 2014-11-24 LAB — BASIC METABOLIC PANEL
ANION GAP: 6 (ref 5–15)
BUN: 24 mg/dL — ABNORMAL HIGH (ref 6–23)
CHLORIDE: 104 meq/L (ref 96–112)
CO2: 27 mmol/L (ref 19–32)
Calcium: 8.8 mg/dL (ref 8.4–10.5)
Creatinine, Ser: 1.76 mg/dL — ABNORMAL HIGH (ref 0.50–1.35)
GFR calc non Af Amer: 41 mL/min — ABNORMAL LOW (ref >90)
GFR, EST AFRICAN AMERICAN: 47 mL/min — AB (ref >90)
Glucose, Bld: 90 mg/dL (ref 70–99)
POTASSIUM: 4.6 mmol/L (ref 3.5–5.1)
SODIUM: 137 mmol/L (ref 135–145)

## 2014-11-24 LAB — CBC WITH DIFFERENTIAL/PLATELET
BASOS ABS: 0.1 10*3/uL (ref 0.0–0.1)
BASOS PCT: 1 % (ref 0–1)
Eosinophils Absolute: 0.6 10*3/uL (ref 0.0–0.7)
Eosinophils Relative: 5 % (ref 0–5)
HEMATOCRIT: 46.7 % (ref 39.0–52.0)
Hemoglobin: 14.8 g/dL (ref 13.0–17.0)
Lymphocytes Relative: 27 % (ref 12–46)
Lymphs Abs: 3.2 10*3/uL (ref 0.7–4.0)
MCH: 30.8 pg (ref 26.0–34.0)
MCHC: 31.7 g/dL (ref 30.0–36.0)
MCV: 97.1 fL (ref 78.0–100.0)
Monocytes Absolute: 0.9 10*3/uL (ref 0.1–1.0)
Monocytes Relative: 7 % (ref 3–12)
NEUTROS ABS: 7.1 10*3/uL (ref 1.7–7.7)
NEUTROS PCT: 60 % (ref 43–77)
Platelets: 235 10*3/uL (ref 150–400)
RBC: 4.81 MIL/uL (ref 4.22–5.81)
RDW: 13.3 % (ref 11.5–15.5)
WBC: 11.9 10*3/uL — AB (ref 4.0–10.5)

## 2014-11-24 MED ORDER — VANCOMYCIN HCL IN DEXTROSE 1-5 GM/200ML-% IV SOLN
1000.0000 mg | Freq: Once | INTRAVENOUS | Status: AC
  Administered 2014-11-24: 1000 mg via INTRAVENOUS
  Filled 2014-11-24: qty 200

## 2014-11-24 MED ORDER — DOXYCYCLINE HYCLATE 100 MG PO CAPS
100.0000 mg | ORAL_CAPSULE | Freq: Two times a day (BID) | ORAL | Status: DC
Start: 2014-11-24 — End: 2018-01-10

## 2014-11-24 MED ORDER — SODIUM CHLORIDE 0.9 % IV SOLN
INTRAVENOUS | Status: DC
  Administered 2014-11-24: 20:00:00 via INTRAVENOUS

## 2014-11-24 MED ORDER — HYDROMORPHONE HCL 1 MG/ML IJ SOLN
1.0000 mg | Freq: Once | INTRAMUSCULAR | Status: AC
  Administered 2014-11-24: 1 mg via INTRAVENOUS
  Filled 2014-11-24: qty 1

## 2014-11-24 MED ORDER — PROMETHAZINE HCL 25 MG PO TABS: 25.0000 mg | ORAL_TABLET | Freq: Four times a day (QID) | ORAL | Status: DC | PRN

## 2014-11-24 MED ORDER — HYDROCODONE-ACETAMINOPHEN 5-325 MG PO TABS: 1.0000 | ORAL_TABLET | Freq: Four times a day (QID) | ORAL | Status: DC | PRN

## 2014-11-24 NOTE — Discharge Instructions (Signed)
Take the antibiotic doxycycline as directed. Hydrocodone is for pain as needed. Phenergan is for nausea and vomiting as needed. He should be improving in 2 days. If not return. Return earlier for any new or worse symptoms.

## 2014-11-24 NOTE — ED Notes (Signed)
Pt with venous stasis ulcers to bilat LE. Pt reports they are healing but pain has been unbearable. Ulcers open and legs red.

## 2014-11-24 NOTE — ED Provider Notes (Signed)
CSN: [id number]     Arrival date & time 11/24/14  1732 History   First MD Initiated Contact with Patient 11/24/14 1853     Chief Complaint  Patient presents with  . Wound Check     (Consider location/radiation/quality/duration/timing/severity/associated sxs/prior Treatment) Patient is a 58 y.o. male presenting with wound check. The history is provided by the patient.  Wound Check Pertinent negatives include no chest pain, no abdominal pain, no headaches and no shortness of breath.   patient would complain of bilateral lower leg pain redness and increased swelling. Started about 3 weeks ago. Got worse since his visit on December 17. Some oozing from the left leg. Patient has a history of venous stasis ulcers in bilateral leg swelling.  Past Medical History  Diagnosis Date  . COPD (chronic obstructive pulmonary disease)   . Chronic kidney disease    Past Surgical History  Procedure Laterality Date  . Back surgery     Family History  Problem Relation Age of Onset  . Diverticulitis Mother   . Cancer Mother 65    pancreas  . Osteoarthritis Father    History  Substance Use Topics  . Smoking status: Current Every Day Smoker -- 1.00 packs/day for 45 years    Types: Cigarettes  . Smokeless tobacco: Never Used  . Alcohol Use: No    Review of Systems  Constitutional: Negative for fever.  HENT: Negative for congestion.   Eyes: Negative for redness.  Respiratory: Negative for shortness of breath.   Cardiovascular: Positive for leg swelling. Negative for chest pain.  Gastrointestinal: Negative for abdominal pain.  Genitourinary: Negative for dysuria.  Musculoskeletal: Negative for back pain.  Skin: Positive for rash and wound.  Neurological: Negative for headaches.  Hematological: Does not bruise/bleed easily.  Psychiatric/Behavioral: Negative for confusion.      Allergies  Tylenol with codeine #3 and Levaquin  Home Medications   Prior to Admission medications    Medication Sig Start Date End Date Taking? Authorizing Provider  naproxen sodium (ALEVE) 220 MG tablet Take 440 mg by mouth daily as needed (for pain).   Yes Historical Provider, MD  azithromycin (ZITHROMAX Z-PAK) 250 MG tablet 2 po day one, then 1 daily x 4 days 11/19/14   Karen Chafe Molpus, MD  doxycycline (VIBRAMYCIN) 100 MG capsule Take 1 capsule (100 mg total) by mouth 2 (two) times daily. 11/24/14   Fredia Sorrow, MD  HYDROcodone-acetaminophen (NORCO/VICODIN) 5-325 MG per tablet Take 1-2 tablets by mouth every 6 (six) hours as needed. 11/24/14   Fredia Sorrow, MD  promethazine (PHENERGAN) 25 MG tablet Take 1 tablet (25 mg total) by mouth every 6 (six) hours as needed. 11/24/14   Fredia Sorrow, MD   BP 152/45 mmHg  Pulse 85  Temp(Src) 98 F (36.7 C) (Oral)  Resp 22  Ht 6' (1.829 m)  Wt 320 lb (145.151 kg)  BMI 43.39 kg/m2  SpO2 100% Physical Exam  Constitutional: He is oriented to person, place, and time. He appears well-developed and well-nourished. No distress.  HENT:  Head: Atraumatic.  Mouth/Throat: Oropharynx is clear and moist.  Eyes: Conjunctivae are normal. Pupils are equal, round, and reactive to light.  Neck: Normal range of motion. Neck supple.  Cardiovascular: Normal rate and regular rhythm.   No murmur heard. Pulmonary/Chest: Effort normal and breath sounds normal. No respiratory distress.  Abdominal: Soft. Bowel sounds are normal. There is no tenderness.  Musculoskeletal: Normal range of motion. He exhibits edema.  Bilateral lower extremity erythema increased  warmth predominantly anteriorly between the knees and ankles. Left anterior shin with serous oozing. No purulent discharge.  Neurological: He is alert and oriented to person, place, and time. No cranial nerve deficit. He exhibits normal muscle tone. Coordination normal.  Skin: Skin is warm. There is erythema.  Nursing note and vitals reviewed.   ED Course  Procedures (including critical care time) Labs  Review Labs Reviewed  CBC WITH DIFFERENTIAL - Abnormal; Notable for the following:    WBC 11.9 (*)    All other components within normal limits  BASIC METABOLIC PANEL - Abnormal; Notable for the following:    BUN 24 (*)    Creatinine, Ser 1.76 (*)    GFR calc non Af Amer 41 (*)    GFR calc Af Amer 47 (*)    All other components within normal limits   Results for orders placed or performed during the hospital encounter of 11/24/14  CBC with Differential  Result Value Ref Range   WBC 11.9 (H) 4.0 - 10.5 K/uL   RBC 4.81 4.22 - 5.81 MIL/uL   Hemoglobin 14.8 13.0 - 17.0 g/dL   HCT 46.7 39.0 - 52.0 %   MCV 97.1 78.0 - 100.0 fL   MCH 30.8 26.0 - 34.0 pg   MCHC 31.7 30.0 - 36.0 g/dL   RDW 13.3 11.5 - 15.5 %   Platelets 235 150 - 400 K/uL   Neutrophils Relative % 60 43 - 77 %   Neutro Abs 7.1 1.7 - 7.7 K/uL   Lymphocytes Relative 27 12 - 46 %   Lymphs Abs 3.2 0.7 - 4.0 K/uL   Monocytes Relative 7 3 - 12 %   Monocytes Absolute 0.9 0.1 - 1.0 K/uL   Eosinophils Relative 5 0 - 5 %   Eosinophils Absolute 0.6 0.0 - 0.7 K/uL   Basophils Relative 1 0 - 1 %   Basophils Absolute 0.1 0.0 - 0.1 K/uL  Basic metabolic panel  Result Value Ref Range   Sodium 137 135 - 145 mmol/L   Potassium 4.6 3.5 - 5.1 mmol/L   Chloride 104 96 - 112 mEq/L   CO2 27 19 - 32 mmol/L   Glucose, Bld 90 70 - 99 mg/dL   BUN 24 (H) 6 - 23 mg/dL   Creatinine, Ser 1.76 (H) 0.50 - 1.35 mg/dL   Calcium 8.8 8.4 - 10.5 mg/dL   GFR calc non Af Amer 41 (L) >90 mL/min   GFR calc Af Amer 47 (L) >90 mL/min   Anion gap 6 5 - 15     Imaging Review No results found.   EKG Interpretation None      MDM   Final diagnoses:  Bilateral cellulitis of lower leg    Bilateral leg cellulitis redness mostly anterior between the ankle and knee. Some oozing from the skin serous type fluid on the left anterior shin. Patient with history of venous stasis ulcers. Received vancomycin here IV will be discharged home on doxycycline  for the next 7 days. Along with pain medicine and Phenergan as needed. Patient will return if not improving in 2 days. Return earlier if worse. Labs here without significant changes compared to baseline.    Fredia Sorrow, MD 11/24/14 2108

## 2014-11-24 NOTE — ED Notes (Signed)
Pt c/o bilateral lower leg pain.  States "it's been going on for about 3 weeks.  Lower legs appear reddened and somewhat swollen.  Pt reports healing stasis ulcers.

## 2014-12-09 ENCOUNTER — Encounter (HOSPITAL_COMMUNITY): Payer: Self-pay

## 2014-12-09 ENCOUNTER — Emergency Department (HOSPITAL_COMMUNITY)
Admission: EM | Admit: 2014-12-09 | Discharge: 2014-12-09 | Disposition: A | Discharge status: Home / Self Care | Payer: Medicare Other | Attending: Emergency Medicine | Admitting: Emergency Medicine

## 2014-12-09 DIAGNOSIS — Z792 Long term (current) use of antibiotics: Secondary | ICD-10-CM | POA: Insufficient documentation

## 2014-12-09 DIAGNOSIS — R21 Rash and other nonspecific skin eruption: Secondary | ICD-10-CM

## 2014-12-09 DIAGNOSIS — Z791 Long term (current) use of non-steroidal anti-inflammatories (NSAID): Secondary | ICD-10-CM | POA: Diagnosis not present

## 2014-12-09 DIAGNOSIS — N189 Chronic kidney disease, unspecified: Secondary | ICD-10-CM | POA: Diagnosis not present

## 2014-12-09 DIAGNOSIS — Z720 Tobacco use: Secondary | ICD-10-CM | POA: Diagnosis not present

## 2014-12-09 DIAGNOSIS — J449 Chronic obstructive pulmonary disease, unspecified: Secondary | ICD-10-CM | POA: Diagnosis not present

## 2014-12-09 MED ORDER — PREDNISONE 50 MG PO TABS
60.0000 mg | ORAL_TABLET | Freq: Once | ORAL | Status: AC
  Administered 2014-12-09: 60 mg via ORAL
  Filled 2014-12-09 (×2): qty 1

## 2014-12-09 MED ORDER — CETIRIZINE HCL 10 MG PO CAPS: 10.0000 mg | ORAL_CAPSULE | Freq: Every day | ORAL | Status: DC | PRN

## 2014-12-09 MED ORDER — FAMOTIDINE 20 MG PO TABS
20.0000 mg | ORAL_TABLET | Freq: Once | ORAL | Status: AC
  Administered 2014-12-09: 20 mg via ORAL
  Filled 2014-12-09: qty 1

## 2014-12-09 MED ORDER — FAMOTIDINE 20 MG PO TABS: 20.0000 mg | ORAL_TABLET | Freq: Two times a day (BID) | ORAL | Status: DC

## 2014-12-09 MED ORDER — SULFAMETHOXAZOLE-TRIMETHOPRIM 800-160 MG PO TABS: 1.0000 | ORAL_TABLET | Freq: Two times a day (BID) | ORAL | Status: DC

## 2014-12-09 MED ORDER — PREDNISONE 20 MG PO TABS: ORAL_TABLET | ORAL | Status: DC

## 2014-12-09 NOTE — Discharge Instructions (Signed)
Take the medications as prescribed. Call Dr Juel Burrow office today to get an appointment to be seen in his office soon. He has an office in [place]. Recheck if you get a fever, have difficulty swallowing, breathing, or seem worse.

## 2014-12-09 NOTE — ED Provider Notes (Signed)
CSN: [id number]     Arrival date & time 12/09/14  0607 History   First MD Initiated Contact with Patient 12/09/14 0622     Chief Complaint  Patient presents with  . Rash     (Consider location/radiation/quality/duration/timing/severity/associated sxs/prior Treatment) HPI  Patient states "I'm eat up". Patient states he started getting a rash 3-4 weeks ago that started on his back and abdomen and has now become more diffuse. He states they are extremely pruritic. He states he has been seen in the ED in the past month for respiratory problems and cellulitis of his legs. He reports that being on medications for that did not improve his rash. Looking at his discharge medications he was treated with a Z-Pak and then doxycycline. I do not see where he was discharged on prednisone although he was given Solu-Medrol in the ED when he had his respiratory visit. He states he's been taking Benadryl without improvement. He states he's been off all of his antibiotics for at least a week. He denies any fever, swelling in his throat, difficulty swallowing or breathing. He denies any change in his soaps or detergents. He states nobody else in his house has a rash. He does have a dog as a pet however it does not not have fleas and it is also not itching or scratching. He states he's never had this before. He states nothing has made it feel better, he states nothing has made it feel worse. He states he thought maybe he had gotten chiggers a few weeks ago however he has not been exposed to that area since then.  PCP Dr Gilford Rile in [place]  Past Medical History  Diagnosis Date  . COPD (chronic obstructive pulmonary disease)   . Chronic kidney disease    Past Surgical History  Procedure Laterality Date  . Back surgery     Family History  Problem Relation Age of Onset  . Diverticulitis Mother   . Cancer Mother 64    pancreas  . Osteoarthritis Father    History  Substance Use Topics  . Smoking status:  Current Every Day Smoker -- 1.00 packs/day for 45 years    Types: Cigarettes  . Smokeless tobacco: Never Used  . Alcohol Use: No  lives at home Lives with elderly father Has a pet dog On disability for his back.   Review of Systems  All other systems reviewed and are negative.     Allergies  Tylenol with codeine #3 and Levaquin  Home Medications   Prior to Admission medications   Medication Sig Start Date End Date Taking? Authorizing Provider  azithromycin (ZITHROMAX Z-PAK) 250 MG tablet 2 po day one, then 1 daily x 4 days 11/19/14   Karen Chafe Molpus, MD  doxycycline (VIBRAMYCIN) 100 MG capsule Take 1 capsule (100 mg total) by mouth 2 (two) times daily. 11/24/14   Fredia Sorrow, MD  HYDROcodone-acetaminophen (NORCO/VICODIN) 5-325 MG per tablet Take 1-2 tablets by mouth every 6 (six) hours as needed. 11/24/14   Fredia Sorrow, MD  naproxen sodium (ALEVE) 220 MG tablet Take 440 mg by mouth daily as needed (for pain).    Historical Provider, MD  promethazine (PHENERGAN) 25 MG tablet Take 1 tablet (25 mg total) by mouth every 6 (six) hours as needed. 11/24/14   Fredia Sorrow, MD   BP 119/91 mmHg  Pulse 94  Temp(Src) 98.2 F (36.8 C) (Oral)  Resp 18  Ht 6' (1.829 m)  Wt 320 lb (145.151 kg)  BMI 43.39  kg/m2  SpO2 100%  Vital signs normal   Physical Exam  Constitutional: He is oriented to person, place, and time. He appears well-developed and well-nourished.  Non-toxic appearance. He does not appear ill. No distress.  HENT:  Head: Normocephalic and atraumatic.  Right Ear: External ear normal.  Left Ear: External ear normal.  Nose: Nose normal. No mucosal edema or rhinorrhea.  Mouth/Throat: Oropharynx is clear and moist and mucous membranes are normal. No dental abscesses or uvula swelling.  Eyes: Conjunctivae and EOM are normal. Pupils are equal, round, and reactive to light.  Neck: Normal range of motion and full passive range of motion without pain. Neck supple.    Pulmonary/Chest: Effort normal. No respiratory distress. He has no rhonchi. He exhibits no crepitus.  Abdominal: Soft. Normal appearance and bowel sounds are normal. He exhibits no distension. There is no tenderness. There is no rebound and no guarding.  Musculoskeletal: Normal range of motion. He exhibits no edema or tenderness.  Moves all extremities well.   Neurological: He is alert and oriented to person, place, and time. He has normal strength. No cranial nerve deficit.  Skin: Skin is warm, dry and intact. Rash noted. No erythema. No pallor.  Patient is noted to have some round areas on his thighs with some dry patchy skin that he states is psoriasis that he gets in the winter. He is noted to have scattered small red raised areas on his trunk and on his proximal upper extremities. He does not have urticarial lesions. They do not have pustules on them. One area on his back looks like it may be trying to develop an abscess. Please look at photos  Psychiatric: He has a normal mood and affect. His speech is normal and behavior is normal. His mood appears not anxious.  Nursing note and vitals reviewed.          ED Course  Procedures (including critical care time) Labs Review Labs Reviewed - No data to display  Imaging Review No results found.   EKG Interpretation None      MDM   Final diagnoses:  Rash   New Prescriptions   CETIRIZINE HCL (ZYRTEC ALLERGY) 10 MG CAPS    Take 1 capsule (10 mg total) by mouth daily as needed.   FAMOTIDINE (PEPCID) 20 MG TABLET    Take 1 tablet (20 mg total) by mouth 2 (two) times daily.   PREDNISONE (DELTASONE) 20 MG TABLET    Take 3 po QD x 3d , then 2 po QD x 3d then 1 po QD x 3d   SULFAMETHOXAZOLE-TRIMETHOPRIM (BACTRIM DS,SEPTRA DS) 800-160 MG PER TABLET    Take 1 tablet by mouth 2 (two) times daily.    Plan discharge  Rolland Porter, MD, Alanson Aly, MD 12/09/14 [phone]

## 2014-12-09 NOTE — ED Notes (Signed)
Itchy red marks started on body around 2nd week December after working on a car in [employer]. Pt thinks he might have gotten chiggars. Now rash is all over torso and extremities and itching

## 2014-12-09 NOTE — ED Notes (Signed)
Patient with no complaints at this time. Respirations even and unlabored. Skin warm/dry. Discharge instructions reviewed with patient at this time. Patient given opportunity to voice concerns/ask questions. Patient discharged at this time and left Emergency Department with steady gait.   

## 2015-04-14 DIAGNOSIS — Z720 Tobacco use: Secondary | ICD-10-CM | POA: Diagnosis not present

## 2015-04-14 DIAGNOSIS — Z792 Long term (current) use of antibiotics: Secondary | ICD-10-CM | POA: Diagnosis not present

## 2015-04-14 DIAGNOSIS — N189 Chronic kidney disease, unspecified: Secondary | ICD-10-CM | POA: Insufficient documentation

## 2015-04-14 DIAGNOSIS — Z79899 Other long term (current) drug therapy: Secondary | ICD-10-CM | POA: Insufficient documentation

## 2015-04-14 DIAGNOSIS — L299 Pruritus, unspecified: Secondary | ICD-10-CM | POA: Insufficient documentation

## 2015-04-14 DIAGNOSIS — R21 Rash and other nonspecific skin eruption: Secondary | ICD-10-CM | POA: Diagnosis not present

## 2015-04-14 NOTE — ED Notes (Addendum)
Patient ambulatory to triage with steady gait, without difficulty or distress noted; pt reports generalized itchy rash x 23month; st has been for seen for same twice elsewhere and rx prednisone but symptoms persists

## 2015-04-15 ENCOUNTER — Encounter: Payer: Self-pay | Admitting: Emergency Medicine

## 2015-04-15 ENCOUNTER — Emergency Department
Admission: EM | Admit: 2015-04-15 | Discharge: 2015-04-15 | Disposition: A | Discharge status: Home / Self Care | Payer: Commercial Managed Care - HMO | Attending: Emergency Medicine | Admitting: Emergency Medicine

## 2015-04-15 DIAGNOSIS — Z79899 Other long term (current) drug therapy: Secondary | ICD-10-CM | POA: Diagnosis not present

## 2015-04-15 DIAGNOSIS — Z792 Long term (current) use of antibiotics: Secondary | ICD-10-CM | POA: Diagnosis not present

## 2015-04-15 DIAGNOSIS — L299 Pruritus, unspecified: Secondary | ICD-10-CM

## 2015-04-15 DIAGNOSIS — R21 Rash and other nonspecific skin eruption: Secondary | ICD-10-CM

## 2015-04-15 DIAGNOSIS — N189 Chronic kidney disease, unspecified: Secondary | ICD-10-CM | POA: Diagnosis not present

## 2015-04-15 DIAGNOSIS — Z720 Tobacco use: Secondary | ICD-10-CM | POA: Diagnosis not present

## 2015-04-15 MED ORDER — HYDROXYZINE HCL 50 MG PO TABS: 50.0000 mg | ORAL_TABLET | Freq: Three times a day (TID) | ORAL | Status: DC | PRN

## 2015-04-15 MED ORDER — HYDROXYZINE HCL 25 MG PO TABS
50.0000 mg | ORAL_TABLET | Freq: Once | ORAL | Status: AC
  Administered 2015-04-15: 50 mg via ORAL

## 2015-04-15 MED ORDER — HYDROXYZINE HCL 25 MG PO TABS
ORAL_TABLET | ORAL | Status: AC
  Administered 2015-04-15: 50 mg via ORAL
  Filled 2015-04-15: qty 2

## 2015-04-15 NOTE — Discharge Instructions (Signed)
1. Take Atarax (#30) as needed for itching. 2. Apply lotion to moist skin several times daily. 3. Return to the ER for worsening symptoms, fever, purulent discharge or other concerns.  Rash A rash is a change in the color or feel of your skin. There are many different types of rashes. You may have other problems along with your rash. HOME CARE  Avoid the thing that caused your rash.  Do not scratch your rash.  You may take cools baths to help stop itching.  Only take medicines as told by your doctor.  Keep all doctor visits as told. GET HELP RIGHT AWAY IF:   Your pain, puffiness (swelling), or redness gets worse.  You have a fever.  You have new or severe problems.  You have body aches, watery poop (diarrhea), or you throw up (vomit).  Your rash is not better after 3 days. MAKE SURE YOU:   Understand these instructions.  Will watch your condition.  Will get help right away if you are not doing well or get worse. Document Released: 05/08/2008 Document Revised: 02/12/2012 Document Reviewed: 09/04/2011 [facility] Patient Information 2015 [place], [place]. This information is not intended to replace advice given to you by your health care provider. Make sure you discuss any questions you have with your health care provider.

## 2015-04-15 NOTE — ED Provider Notes (Signed)
[facility] Emergency Department Provider Note  ____________________________________________  Time seen: Approximately 5:08 AM  I have reviewed the triage vital signs and the nursing notes.   HISTORY  Chief Complaint Rash    HPI Andrew Estes is a 59 y.o. male who presents with a 9 month history of generalized itchy rash. Patient states rash initially began on bilateral legs and subsequently spread to trunk and back.Patient states he has been seen at an outside facility 3 times, given prednisone and hydrocortisone cream without relief of symptoms. Patient has never seen a dermatologist for the symptoms. Patient presents tonight due to being unable to sleep secondary to pruritus. Patient denies history of liver issues. Patient denies recent travel history. Denies tick exposure. Lives with father who does not share similar symptoms of rash.   Past Medical History  Diagnosis Date  . COPD (chronic obstructive pulmonary disease)   . Chronic kidney disease     Patient Active Problem List   Diagnosis Date Noted  . Acute respiratory failure 12/31/2013  . COPD exacerbation 12/31/2013  . Pedal edema 12/31/2013  . Hyperlipidemia 12/31/2013  . SOB (shortness of breath) possible Pulmonary embolus 06/15/2012  . Leg edema, right, psooible DVT 06/15/2012  . Renal failure, unspecified 06/15/2012  . Obesity 06/15/2012  . Hyperglycemia 06/15/2012  . Tobacco abuse 06/15/2012  . Chronic back pain 06/15/2012    Past Surgical History  Procedure Laterality Date  . Back surgery      Current Outpatient Rx  Name  Route  Sig  Dispense  Refill  . albuterol (PROVENTIL HFA;VENTOLIN HFA) 108 (90 BASE) MCG/ACT inhaler   Inhalation   Inhale 2 puffs into the lungs every 6 (six) hours as needed for wheezing or shortness of breath.         . budesonide-formoterol (SYMBICORT) 160-4.5 MCG/ACT inhaler   Inhalation   Inhale 2 puffs into the lungs 2 (two) times daily.          Marland Kitchen azithromycin (ZITHROMAX Z-PAK) 250 MG tablet      2 po day one, then 1 daily x 4 days   6 tablet   0   . Cetirizine HCl (ZYRTEC ALLERGY) 10 MG CAPS   Oral   Take 1 capsule (10 mg total) by mouth daily as needed.   14 capsule   0   . doxycycline (VIBRAMYCIN) 100 MG capsule   Oral   Take 1 capsule (100 mg total) by mouth 2 (two) times daily.   14 capsule   0   . famotidine (PEPCID) 20 MG tablet   Oral   Take 1 tablet (20 mg total) by mouth 2 (two) times daily.   20 tablet   0   . HYDROcodone-acetaminophen (NORCO/VICODIN) 5-325 MG per tablet   Oral   Take 1-2 tablets by mouth every 6 (six) hours as needed.   20 tablet   0   . hydrOXYzine (ATARAX/VISTARIL) 50 MG tablet   Oral   Take 1 tablet (50 mg total) by mouth every 8 (eight) hours as needed for itching.   30 tablet   0   . naproxen sodium (ALEVE) 220 MG tablet   Oral   Take 440 mg by mouth daily as needed (for pain).         . predniSONE (DELTASONE) 20 MG tablet      Take 3 po QD x 3d , then 2 po QD x 3d then 1 po QD x 3d   18 tablet  0   . promethazine (PHENERGAN) 25 MG tablet   Oral   Take 1 tablet (25 mg total) by mouth every 6 (six) hours as needed.   12 tablet   0   . sulfamethoxazole-trimethoprim (BACTRIM DS,SEPTRA DS) 800-160 MG per tablet   Oral   Take 1 tablet by mouth 2 (two) times daily.   20 tablet   0     Allergies Oxycontin; Tylenol with codeine #3; and Levaquin  Family History  Problem Relation Age of Onset  . Diverticulitis Mother   . Cancer Mother 87    pancreas  . Osteoarthritis Father     Social History History  Substance Use Topics  . Smoking status: Current Every Day Smoker -- 1.00 packs/day for 45 years    Types: Cigarettes  . Smokeless tobacco: Never Used  . Alcohol Use: No    Review of Systems Constitutional: No fever/chills Eyes: No visual changes. ENT: No sore throat. Cardiovascular: Denies chest pain. Respiratory: Denies shortness of  breath. Gastrointestinal: No abdominal pain.  No nausea, no vomiting.  No diarrhea.  No constipation. Genitourinary: Negative for dysuria. Musculoskeletal: Negative for back pain. Skin: Positive for rash. Neurological: Negative for headaches, focal weakness or numbness.  10-point ROS otherwise negative.  ____________________________________________   PHYSICAL EXAM:  VITAL SIGNS: ED Triage Vitals  Enc Vitals Group     BP 04/14/15 2342 146/92 mmHg     Pulse Rate 04/14/15 2342 91     Resp 04/14/15 2342 22     Temp 04/14/15 2342 98 F (36.7 C)     Temp Source 04/14/15 2342 Oral     SpO2 04/14/15 2342 100 %     Weight 04/14/15 2342 310 lb (140.615 kg)     Height 04/14/15 2342 6' (1.829 m)     Head Cir --      Peak Flow --      Pain Score --      Pain Loc --      Pain Edu? --      Excl. in [place]? --     Constitutional: Alert and oriented. Well appearing and in no acute distress. Eyes: Conjunctivae are normal. PERRL. EOMI. Head: Atraumatic. Nose: No congestion/rhinnorhea. Mouth/Throat: Mucous membranes are moist.  Oropharynx non-erythematous. Neck: No stridor.   Cardiovascular: Normal rate, regular rhythm. Grossly normal heart sounds.  Good peripheral circulation. Respiratory: Normal respiratory effort.  No retractions. Lungs CTAB. Gastrointestinal: Soft and nontender. No distention. No abdominal bruits. No CVA tenderness. Musculoskeletal: No lower extremity tenderness nor edema.  No joint effusions. Neurologic:  Normal speech and language. No gross focal neurologic deficits are appreciated. Speech is normal. No gait instability. Skin:  Skin is warm, dry and intact. Patchy, excoriated maculopapular rash noted to bilateral lower extremities and lower back. Patchy plaques resembling psoriasis noted to bilateral elbows and right anterior knee. Patches of generalized eczema noted. No vesicles or petechiae noted; no warmth or erythema noted. No active signs of infection  noted Psychiatric: Mood and affect are normal. Speech and behavior are normal.  ____________________________________________   LABS (all labs ordered are listed, but only abnormal results are displayed)  Labs Reviewed - No data to display ____________________________________________  EKG  None ____________________________________________  RADIOLOGY  None ____________________________________________   PROCEDURES  Procedure(s) performed: None  Critical Care performed: No  ____________________________________________   INITIAL IMPRESSION / ASSESSMENT AND PLAN / ED COURSE  Pertinent labs & imaging results that were available during my care of the patient were reviewed  by me and considered in my medical decision making (see chart for details).  59 year old male presents with a 9 month history of generalized, itchy rash. Presents tonight due to pruritus. Discussed with patient the appearance of rash may resemble bedbugs versus scabies versus dermatitis not otherwise specified. Hesitant to keep lying patient with prednisone and hydrocortisone cream without known etiology of rash. Will prescribe Atarax for itching and encourage patient to follow-up with a dermatologist. Strict return precautions given. Patient verbalizes understanding and agrees. ____________________________________________   FINAL CLINICAL IMPRESSION(S) / ED DIAGNOSES  Final diagnoses:  Rash  Pruritus      Paulette Blanch, MD 04/15/15 [phone]

## 2015-04-15 NOTE — ED Notes (Signed)
Pt verbalizes understanding of discharge instructions.

## 2016-11-02 DIAGNOSIS — L089 Local infection of the skin and subcutaneous tissue, unspecified: Secondary | ICD-10-CM | POA: Diagnosis not present

## 2017-05-01 DIAGNOSIS — L03116 Cellulitis of left lower limb: Secondary | ICD-10-CM | POA: Diagnosis not present

## 2017-06-09 DIAGNOSIS — W19XXXA Unspecified fall, initial encounter: Secondary | ICD-10-CM | POA: Diagnosis not present

## 2017-06-09 DIAGNOSIS — R0781 Pleurodynia: Secondary | ICD-10-CM | POA: Diagnosis not present

## 2017-06-09 DIAGNOSIS — S4992XA Unspecified injury of left shoulder and upper arm, initial encounter: Secondary | ICD-10-CM | POA: Diagnosis not present

## 2017-12-28 ENCOUNTER — Ambulatory Visit: Payer: Self-pay | Admitting: Family Medicine

## 2018-01-10 ENCOUNTER — Encounter: Payer: Self-pay | Admitting: Family Medicine

## 2018-01-10 ENCOUNTER — Ambulatory Visit (INDEPENDENT_AMBULATORY_CARE_PROVIDER_SITE_OTHER): Payer: Medicare HMO | Admitting: Family Medicine

## 2018-01-10 VITALS — BP 136/84 | HR 84 | Temp 98.4°F | Resp 24 | Ht 71.0 in | Wt 329.0 lb

## 2018-01-10 DIAGNOSIS — Z136 Encounter for screening for cardiovascular disorders: Secondary | ICD-10-CM | POA: Diagnosis not present

## 2018-01-10 DIAGNOSIS — R739 Hyperglycemia, unspecified: Secondary | ICD-10-CM | POA: Diagnosis not present

## 2018-01-10 DIAGNOSIS — M1990 Unspecified osteoarthritis, unspecified site: Secondary | ICD-10-CM | POA: Insufficient documentation

## 2018-01-10 DIAGNOSIS — E785 Hyperlipidemia, unspecified: Secondary | ICD-10-CM | POA: Diagnosis not present

## 2018-01-10 DIAGNOSIS — G4700 Insomnia, unspecified: Secondary | ICD-10-CM

## 2018-01-10 DIAGNOSIS — R918 Other nonspecific abnormal finding of lung field: Secondary | ICD-10-CM

## 2018-01-10 DIAGNOSIS — N189 Chronic kidney disease, unspecified: Secondary | ICD-10-CM | POA: Diagnosis not present

## 2018-01-10 DIAGNOSIS — N183 Chronic kidney disease, stage 3 unspecified: Secondary | ICD-10-CM | POA: Insufficient documentation

## 2018-01-10 DIAGNOSIS — F439 Reaction to severe stress, unspecified: Secondary | ICD-10-CM | POA: Diagnosis not present

## 2018-01-10 DIAGNOSIS — J449 Chronic obstructive pulmonary disease, unspecified: Secondary | ICD-10-CM

## 2018-01-10 DIAGNOSIS — Z720 Tobacco use: Secondary | ICD-10-CM | POA: Diagnosis not present

## 2018-01-10 DIAGNOSIS — J309 Allergic rhinitis, unspecified: Secondary | ICD-10-CM | POA: Insufficient documentation

## 2018-01-10 MED ORDER — NICOTINE 21 MG/24HR TD PT24: 21.0000 mg | MEDICATED_PATCH | Freq: Every day | TRANSDERMAL | 0 refills | Status: DC

## 2018-01-10 MED ORDER — SERTRALINE HCL 50 MG PO TABS: 50.0000 mg | ORAL_TABLET | Freq: Every day | ORAL | 3 refills | Status: DC

## 2018-01-10 MED ORDER — BUDESONIDE-FORMOTEROL FUMARATE 160-4.5 MCG/ACT IN AERO: 2.0000 | INHALATION_SPRAY | Freq: Two times a day (BID) | RESPIRATORY_TRACT | 5 refills | Status: DC

## 2018-01-10 MED ORDER — TRAZODONE HCL 100 MG PO TABS
100.0000 mg | ORAL_TABLET | Freq: Every evening | ORAL | 2 refills | Status: DC | PRN
Start: 2018-01-10 — End: 2018-02-21

## 2018-01-10 NOTE — Progress Notes (Signed)
Patient: Andrew Estes Male    DOB: [birth year]   62 y.o.   MRN: [id number] Visit Date: 01/10/2018  [name]: Lelon Huh, MD   Chief Complaint  Patient presents with  . New Patient (Initial Visit)  . COPD  . Anxiety   Subjective:    HPI New Patient establishing Care: Patient comes in todays to establish care with several chronic complaints. He has no previous PCP. He is the son of Andrew Estes  COPD: Patient reports he has a history of COPD with history of multiple hospital admissions for COPD exacerbations with last admission being in Janary 2015. Was prescribed inhalers in the past but did not have insurance so has not taken them consistently as outpatient. He states his breathing has been slowly and steadily worse over the last few years . He would also like to quit smoking and states that nicotine patches given to him in the hospital worked very well  Anxiety: Patient reports he has had issues with Anxiety for the past 2 years. He has never been on any medication to help treat this problem. Patient admits that at the moment he is depressed due to have multiple relatives living in his home and stress associated with managing a campground at [employer]. States he has a lot of trouble sleeping at night as well.   Past Surgical History:  Procedure Laterality Date  . BACK SURGERY     has had a total of 5 back surgeries   Patient Active Problem List   Diagnosis Date Noted  . Insomnia 01/10/2018  . Situational stress 01/10/2018  . COPD (chronic obstructive pulmonary disease) ([place])   . Chronic kidney disease   . Arthritis   . Allergic rhinitis   . Pedal edema 12/31/2013  . Hyperlipidemia 12/31/2013  . Morbid obesity ([place]) 06/15/2012  . Hyperglycemia 06/15/2012  . Tobacco abuse 06/15/2012  . Chronic back pain 06/15/2012   Past Medical History:  Diagnosis Date  . Renal failure, unspecified 06/15/2012       Allergies  Allergen Reactions  .  Oxycontin [Oxycodone Hcl] Nausea And Vomiting  . Tylenol With Codeine #3 [Acetaminophen-Codeine] Nausea And Vomiting  . Levaquin [Levofloxacin] Rash and Other (See Comments)    REACTION: ORAL rash (breakout)     Current Outpatient Medications:  .  albuterol (PROVENTIL HFA;VENTOLIN HFA) 108 (90 BASE) MCG/ACT inhaler, Inhale 2 puffs into the lungs every 6 (six) hours as needed for wheezing or shortness of breath., Disp: , Rfl: (NOT TAKING) .  budesonide-formoterol (SYMBICORT) 160-4.5 MCG/ACT inhaler, Inhale 2 puffs into the lungs 2 (two) times daily., Disp: , Rfl: (NOT TAKING)  Review of Systems  Constitutional: Negative for appetite change, chills, fatigue and fever.  HENT: Negative for congestion, ear pain, hearing loss, nosebleeds and trouble swallowing.   Eyes: Negative for pain and visual disturbance.  Respiratory: Positive for shortness of breath. Negative for cough and chest tightness.   Cardiovascular: Negative for chest pain, palpitations and leg swelling.  Gastrointestinal: Negative for abdominal pain, blood in stool, constipation, diarrhea, nausea and vomiting.  Endocrine: Negative for polydipsia, polyphagia and polyuria.  Genitourinary: Negative for dysuria and flank pain.  Musculoskeletal: Negative for arthralgias, back pain, joint swelling, myalgias and neck stiffness.  Skin: Positive for color change (in lower legs). Negative for rash and wound.  Neurological: Negative for dizziness, tremors, seizures, speech difficulty, weakness, light-headedness and headaches.  Psychiatric/Behavioral: Positive for agitation and dysphoric mood. Negative for behavioral problems,  confusion, decreased concentration and sleep disturbance. The patient is not nervous/anxious.        Stressed  All other systems reviewed and are negative.   Social History   Tobacco Use  . Smoking status: Current Every Day Smoker    Packs/day: 1.00    Years: 45.00    Pack years: 45.00    Types: Cigarettes  .  Smokeless tobacco: Never Used  . Tobacco comment: started smoking at age 39  Substance Use Topics  . Alcohol use: No   Objective:   BP 136/84 (BP Location: Left Arm, Patient Position: Sitting, Cuff Size: Large)   Pulse 84   Temp 98.4 F (36.9 C) (Oral)   Resp (!) 24   Ht 5\' 11"  (1.803 m)   Wt (!) 329 lb (149.2 kg)   SpO2 98% Comment: room air  BMI 45.89 kg/m   Physical Exam   General Appearance:    Alert, cooperative, no distress, appears stated age, morbidly obese  Head:    Normocephalic, without obvious abnormality, atraumatic  Eyes:    PERRL, conjunctiva/corneas clear, EOM's intact, fundi    benign, both eyes       Ears:    Normal TM's and external ear canals, both ears  Nose:   Nares normal, septum midline, mucosa normal, no drainage   or sinus tenderness  Throat:   Lips, mucosa, and tongue normal; teeth and gums normal  Neck:   Supple, symmetrical, trachea midline, no adenopathy;       thyroid:  No enlargement/tenderness/nodules; no carotid   bruit or JVD  Back:     Symmetric, no curvature, ROM normal, no CVA tenderness  Lungs:     Occasional expiratory wheeze, no rales, , respirations unlabored  Chest wall:    No tenderness or deformity  Heart:    Regular rate and rhythm, S1 and S2 normal, no murmur, rub   or gallop  Abdomen:     Soft, non-tender, bowel sounds active all four quadrants,    no masses, no organomegaly  Genitalia:    deferred  Rectal:    deferred  Extremities:   Extremities normal, atraumatic, no cyanosis or edema  Pulses:   2+ and symmetric all extremities  Skin:   Skin color, texture, turgor normal, no rashes or lesions  Lymph nodes:   Cervical, supraclavicular, and axillary nodes normal  Neurologic:   CNII-XII intact. Normal strength, sensation and reflexes      throughout        Assessment & Plan:     1. Chronic obstructive pulmonary disease, unspecified COPD type ([place]) Not currently on any maintenance inhalers.  - budesonide-formoterol  (SYMBICORT) 160-4.5 MCG/ACT inhaler; Inhale 2 puffs into the lungs 2 (two) times daily.  Dispense: 1 Inhaler; Refill: 5  2. Insomnia, unspecified type  - traZODone (DESYREL) 100 MG tablet; Take 1 tablet (100 mg total) by mouth at bedtime as needed for sleep.  Dispense: 30 tablet; Refill: 2  3. Morbid obesity ([place])   - TSH - Lipid panel  4. Hyperglycemia  - Hemoglobin A1c  5. Tobacco abuse Smoking cessation counseling including option of prescription medications. He states he did well with nicotine patch when he was hospitalized a few years ago, so he would like to try that first.  - nicotine (NICODERM CQ - DOSED IN MG/24 HOURS) 21 mg/24hr patch; Place 1 patch (21 mg total) onto the skin daily.  Dispense: 28 patch; Refill: 0  6. Encounter for special screening examination for  cardiovascular disorder  - Lipid panel  7. Abnormal findings on diagnostic imaging of lung  - CT Chest Wo Contrast; Future  8. Situational stress  - sertraline (ZOLOFT) 50 MG tablet; Take 1 tablet (50 mg total) by mouth daily. 1/2 tablet for 6 days, then 1 tablet daily  Dispense: 30 tablet; Refill: 3  9. Chronic kidney disease, unspecified CKD stage - Comprehensive metabolic panel       Lelon Huh, MD  [facility]

## 2018-01-11 ENCOUNTER — Other Ambulatory Visit: Payer: Self-pay | Admitting: Family Medicine

## 2018-01-11 DIAGNOSIS — N183 Chronic kidney disease, stage 3 unspecified: Secondary | ICD-10-CM

## 2018-01-11 LAB — COMPREHENSIVE METABOLIC PANEL
A/G RATIO: 1.4 (ref 1.2–2.2)
ALT: 14 IU/L (ref 0–44)
AST: 14 IU/L (ref 0–40)
Albumin: 3.9 g/dL (ref 3.6–4.8)
Alkaline Phosphatase: 91 IU/L (ref 39–117)
BILIRUBIN TOTAL: 0.4 mg/dL (ref 0.0–1.2)
BUN/Creatinine Ratio: 9 — ABNORMAL LOW (ref 10–24)
BUN: 19 mg/dL (ref 8–27)
CALCIUM: 8.9 mg/dL (ref 8.6–10.2)
CHLORIDE: 103 mmol/L (ref 96–106)
CO2: 24 mmol/L (ref 20–29)
Creatinine, Ser: 2.04 mg/dL — ABNORMAL HIGH (ref 0.76–1.27)
GFR calc Af Amer: 39 mL/min/{1.73_m2} — ABNORMAL LOW (ref >59)
GFR, EST NON AFRICAN AMERICAN: 34 mL/min/{1.73_m2} — AB (ref >59)
Globulin, Total: 2.8 g/dL (ref 1.5–4.5)
Glucose: 68 mg/dL (ref 65–99)
POTASSIUM: 4.7 mmol/L (ref 3.5–5.2)
Sodium: 144 mmol/L (ref 134–144)
TOTAL PROTEIN: 6.7 g/dL (ref 6.0–8.5)

## 2018-01-11 LAB — TSH: TSH: 2.97 u[IU]/mL (ref 0.450–4.500)

## 2018-01-11 LAB — HEMOGLOBIN A1C
Est. average glucose Bld gHb Est-mCnc: 100 mg/dL
HEMOGLOBIN A1C: 5.1 % (ref 4.8–5.6)

## 2018-01-11 LAB — LIPID PANEL
CHOL/HDL RATIO: 4.9 ratio (ref 0.0–5.0)
Cholesterol, Total: 180 mg/dL (ref 100–199)
HDL: 37 mg/dL — AB (ref >39)
LDL Calculated: 127 mg/dL — ABNORMAL HIGH (ref 0–99)
Triglycerides: 80 mg/dL (ref 0–149)
VLDL Cholesterol Cal: 16 mg/dL (ref 5–40)

## 2018-01-23 ENCOUNTER — Telehealth: Payer: Self-pay | Admitting: Family Medicine

## 2018-01-28 ENCOUNTER — Ambulatory Visit
Admission: RE | Admit: 2018-01-28 | Discharge: 2018-01-28 | Disposition: A | Discharge status: Home / Self Care | Payer: Medicare HMO | Source: Ambulatory Visit | Attending: Family Medicine | Admitting: Family Medicine

## 2018-01-28 DIAGNOSIS — R911 Solitary pulmonary nodule: Secondary | ICD-10-CM | POA: Diagnosis not present

## 2018-01-28 DIAGNOSIS — R918 Other nonspecific abnormal finding of lung field: Secondary | ICD-10-CM

## 2018-01-28 DIAGNOSIS — F17200 Nicotine dependence, unspecified, uncomplicated: Secondary | ICD-10-CM | POA: Insufficient documentation

## 2018-01-28 DIAGNOSIS — J439 Emphysema, unspecified: Secondary | ICD-10-CM | POA: Insufficient documentation

## 2018-01-28 DIAGNOSIS — J90 Pleural effusion, not elsewhere classified: Secondary | ICD-10-CM | POA: Insufficient documentation

## 2018-01-30 ENCOUNTER — Telehealth: Payer: Self-pay | Admitting: Family Medicine

## 2018-01-30 DIAGNOSIS — J90 Pleural effusion, not elsewhere classified: Secondary | ICD-10-CM

## 2018-01-30 NOTE — Telephone Encounter (Signed)
Please schedule referral to pulmonary.

## 2018-01-30 NOTE — Telephone Encounter (Signed)
-----   Message from [name], MD sent at 01/30/2018  8:07 AM EST ----- CT shows pleural effusion (small amount of fluid around right lung). Small nodule in right lung which is probably benign as it has not changed for several years. Need referral to pulmonary for follow up of pleural effusion.

## 2018-01-30 NOTE — Telephone Encounter (Signed)
Patient was notified of results. Expressed understanding. Referral in epic. 

## 2018-01-30 NOTE — Telephone Encounter (Signed)
LMTCB and schedule AWV prior to cpe on 02/21/18. -MM

## 2018-01-30 NOTE — Telephone Encounter (Signed)
Pt calling regarding his CT results.

## 2018-02-11 NOTE — Telephone Encounter (Signed)
Left message regarding need to schedule Initial MWV prior to physical f/u on 02/21/18.  Can do at 8:30 or on day before appt as times are available beginning 02/12/18

## 2018-02-11 NOTE — Telephone Encounter (Signed)
Pt stated he was returning a call. I thought it might be in regards to his pulmonary referral. [phone] Please advise. Thanks TNP

## 2018-02-12 NOTE — Telephone Encounter (Signed)
Pt advised that [place] pulmonary department will contact him with appointment

## 2018-02-12 NOTE — Telephone Encounter (Signed)
Scheduled pt at 830 with [facility] for AWV prior to cpe (f/u to awv) @ 9 with Dr Caryn Section.

## 2018-02-13 ENCOUNTER — Ambulatory Visit (INDEPENDENT_AMBULATORY_CARE_PROVIDER_SITE_OTHER): Payer: Medicare HMO | Admitting: Family Medicine

## 2018-02-13 ENCOUNTER — Encounter: Payer: Self-pay | Admitting: Family Medicine

## 2018-02-13 VITALS — BP 134/80 | HR 76 | Temp 97.6°F | Resp 24

## 2018-02-13 DIAGNOSIS — K0500 Acute gingivitis, plaque induced: Secondary | ICD-10-CM | POA: Diagnosis not present

## 2018-02-13 MED ORDER — HYDROCODONE-ACETAMINOPHEN 7.5-325 MG PO TABS: 1.0000 | ORAL_TABLET | Freq: Four times a day (QID) | ORAL | 0 refills | Status: AC | PRN

## 2018-02-13 MED ORDER — HYDROCODONE-ACETAMINOPHEN 7.5-325 MG PO TABS: 1.0000 | ORAL_TABLET | Freq: Four times a day (QID) | ORAL | 0 refills | Status: DC | PRN

## 2018-02-13 MED ORDER — AMOXICILLIN-POT CLAVULANATE 875-125 MG PO TABS: 1.0000 | ORAL_TABLET | Freq: Two times a day (BID) | ORAL | 0 refills | Status: AC

## 2018-02-13 NOTE — Progress Notes (Signed)
Patient: Andrew Estes Male    DOB: 23-Apr-1956   62 y.o.   MRN: [id number] Visit Date: 02/13/2018  [name]: Lelon Huh, MD   Chief Complaint  Patient presents with  . Oral Pain   Subjective:    Oral Pain   This is a new problem. Episode onset: 3-4 days ago. The problem has been gradually worsening. Associated symptoms include facial pain. Pertinent negatives include no fever. He has tried heat for the symptoms. The treatment provided no relief.  Patient thinks he may have an infection in the upper left side of his mouth. He states he got foot stuck between his teeth and which he removed by digging into his gum. Patient has pain and swelling of the upper left side of his mouth.       Allergies  Allergen Reactions  . Oxycontin [Oxycodone Hcl] Nausea And Vomiting  . Tylenol With Codeine #3 [Acetaminophen-Codeine] Nausea And Vomiting  . Levaquin [Levofloxacin] Rash and Other (See Comments)    REACTION: ORAL rash (breakout)     Current Outpatient Medications:  .  budesonide-formoterol (SYMBICORT) 160-4.5 MCG/ACT inhaler, Inhale 2 puffs into the lungs 2 (two) times daily., Disp: 1 Inhaler, Rfl: 5 .  nicotine (NICODERM CQ - DOSED IN MG/24 HOURS) 21 mg/24hr patch, Place 1 patch (21 mg total) onto the skin daily., Disp: 28 patch, Rfl: 0 .  sertraline (ZOLOFT) 50 MG tablet, Take 1 tablet (50 mg total) by mouth daily. 1/2 tablet for 6 days, then 1 tablet daily, Disp: 30 tablet, Rfl: 3 .  traZODone (DESYREL) 100 MG tablet, Take 1 tablet (100 mg total) by mouth at bedtime as needed for sleep., Disp: 30 tablet, Rfl: 2 .  albuterol (PROVENTIL HFA;VENTOLIN HFA) 108 (90 BASE) MCG/ACT inhaler, Inhale 2 puffs into the lungs every 6 (six) hours as needed for wheezing or shortness of breath., Disp: , Rfl:   Review of Systems  Constitutional: Negative for fever.    Social History   Tobacco Use  . Smoking status: Current Every Day Smoker    Packs/day: 0.50    Years: 45.00    Pack years: 22.50    Types: Cigarettes  . Smokeless tobacco: Never Used  . Tobacco comment: started smoking at age 74  Substance Use Topics  . Alcohol use: No   Objective:   BP 134/80 (BP Location: Left Arm, Patient Position: Sitting, Cuff Size: Large)   Pulse 76   Temp 97.6 F (36.4 C) (Oral)   Resp (!) 24   SpO2 97% Comment: room air    Physical Exam  General Appearance:    Alert, cooperative, no distress  HENT:   bilateral TM normal without fluid or infection, neck without nodes and throat normal without erythema or exudate. Moderately swollen and inflamed gingiva around upper left molars. No discharge.   Eyes:    PERRL, conjunctiva/corneas clear, EOM's intact       Lungs:     Clear to auscultation bilaterally, respirations unlabored  Heart:    Regular rate and rhythm  Neurologic:   Awake, alert, oriented x 3. No apparent focal neurological           defect.           Assessment & Plan:     1. Gingivitis, acute Discussed warm salt water gargles and OTC NSAIDs prn.  - amoxicillin-clavulanate (AUGMENTIN) 875-125 MG tablet; Take 1 tablet by mouth 2 (two) times daily for 7 days.  Dispense:  14 tablet; Refill: 0 - HYDROcodone-acetaminophen (NORCO) 7.5-325 MG tablet; Take 1 tablet by mouth every 6 (six) hours as needed for up to 5 days for moderate pain.  Dispense: 12 tablet; Refill: 0      Lelon Huh, MD  [facility]

## 2018-02-15 DIAGNOSIS — R809 Proteinuria, unspecified: Secondary | ICD-10-CM | POA: Diagnosis not present

## 2018-02-15 DIAGNOSIS — N183 Chronic kidney disease, stage 3 (moderate): Secondary | ICD-10-CM | POA: Diagnosis not present

## 2018-02-21 ENCOUNTER — Ambulatory Visit (INDEPENDENT_AMBULATORY_CARE_PROVIDER_SITE_OTHER): Payer: Medicare HMO

## 2018-02-21 ENCOUNTER — Ambulatory Visit (INDEPENDENT_AMBULATORY_CARE_PROVIDER_SITE_OTHER): Payer: Medicare HMO | Admitting: Family Medicine

## 2018-02-21 VITALS — BP 118/70 | HR 76 | Temp 97.8°F | Ht 71.0 in | Wt 314.8 lb

## 2018-02-21 DIAGNOSIS — J449 Chronic obstructive pulmonary disease, unspecified: Secondary | ICD-10-CM

## 2018-02-21 DIAGNOSIS — J90 Pleural effusion, not elsewhere classified: Secondary | ICD-10-CM | POA: Diagnosis not present

## 2018-02-21 DIAGNOSIS — G4700 Insomnia, unspecified: Secondary | ICD-10-CM | POA: Diagnosis not present

## 2018-02-21 DIAGNOSIS — Z0000 Encounter for general adult medical examination without abnormal findings: Secondary | ICD-10-CM

## 2018-02-21 DIAGNOSIS — H539 Unspecified visual disturbance: Secondary | ICD-10-CM | POA: Diagnosis not present

## 2018-02-21 DIAGNOSIS — F439 Reaction to severe stress, unspecified: Secondary | ICD-10-CM | POA: Diagnosis not present

## 2018-02-21 MED ORDER — SERTRALINE HCL 100 MG PO TABS: 100.0000 mg | ORAL_TABLET | Freq: Every day | ORAL | 5 refills | Status: DC

## 2018-02-21 MED ORDER — TRAZODONE HCL 150 MG PO TABS: 150.0000 mg | ORAL_TABLET | Freq: Every evening | ORAL | 5 refills | Status: DC | PRN

## 2018-02-21 NOTE — Progress Notes (Signed)
Patient: Andrew Estes, Male    DOB: November 07, 1956, 62 y.o.   MRN: [id number] Visit Date: 02/21/2018  [name]: Lelon Huh, MD   Chief Complaint  Patient presents with  . Annual Exam  . COPD  . Insomnia   Subjective:   Patient saw [facility]enzie for AWV this morning at 8:30 am.   Annual physical exam Andrew Estes is a 62 y.o. male who presents today for health maintenance and complete physical. He feels fairly well. He reports exercising none. He reports he is sleeping poorly.  ------------------------------------------------------------  Chronic obstructive pulmonary disease, unspecified COPD type ([place]) From 01/10/2018-started budesonide-formoterol (SYMBICORT) 160-4.5 MCG/ACT inhaler.Was referred to pulmonary but he has not yet heard from their office. States breathing is greatly improved since starting inhaler.   Insomnia, unspecified type From 01/10/2018-started traZODone (DESYREL) 100 MG tablet, he states this helps but has been taking an extrea 1/2 tablet frequently.   Tobacco abuse From 01/10/2018-started nicotine (NICODERM CQ - DOSED IN MG/24 HOURS) 21 mg/24hr patch.  Down to 3/4 ppd and working on quitting comletely.   Situational stress From 01/10/2018-started sertraline (ZOLOFT) 50 MG tablet; Take 1 tablet (50 mg total) by mouth daily.He states he continues to have a tremendous amount of situational stress at home, but medication is helping. He feels he needs higher dose. He is not having any adverse effects.    Wt Readings from Last 3 Encounters:  02/21/18 (!) 314 lb 12.8 oz (142.8 kg)  01/10/18 (!) 329 lb (149.2 kg)  04/15/15 (!) 310 lb (140.6 kg)      Review of Systems  HENT: Positive for dental problem.   Musculoskeletal: Positive for arthralgias and back pain.  Skin: Positive for rash.  Psychiatric/Behavioral: Positive for sleep disturbance. The patient is nervous/anxious.   All other systems reviewed and are negative.   Social History   He  reports that he has been smoking cigarettes.  He has a 45.00 pack-year smoking history. He has never used smokeless tobacco. He reports that he does not drink alcohol or use drugs.       Social History   Socioeconomic History  . Marital status: Single    Spouse name: Not on file  . Number of children: 1  . Years of education: Not on file  . Highest education level: Not on file  Occupational History  . Occupation: Educational psychologist: [employer]    Comment: on workers comp x 2.5 yrs  . Occupation: disability  Social Needs  . Financial resource strain: Very hard  . Food insecurity:    Worry: Never true    Inability: Never true  . Transportation needs:    Medical: No    Non-medical: No  Tobacco Use  . Smoking status: Current Every Day Smoker    Packs/day: 1.00    Years: 45.00    Pack years: 45.00    Types: Cigarettes  . Smokeless tobacco: Never Used  . Tobacco comment: started smoking at age 31  Substance and Sexual Activity  . Alcohol use: No  . Drug use: No  . Sexual activity: Yes  Lifestyle  . Physical activity:    Days per week: Not on file    Minutes per session: Not on file  . Stress: Very much  Relationships  . Social connections:    Talks on phone: Not on file    Gets together: Not on file    Attends religious service: Not  on file    Active member of club or organization: Not on file    Attends meetings of clubs or organizations: Not on file    Relationship status: Not on file  Other Topics Concern  . Not on file  Social History Narrative  . Not on file    Past Medical History:  Diagnosis Date  . Hyperlipidemia   . Obesity   . Renal failure, unspecified 06/15/2012     Patient Active Problem List   Diagnosis Date Noted  . Insomnia 01/10/2018  . Situational stress 01/10/2018  . COPD (chronic obstructive pulmonary disease) ([place])   . Chronic kidney disease   . Arthritis   . Allergic rhinitis   . Pedal edema 12/31/2013    . Hyperlipidemia 12/31/2013  . Morbid obesity ([place]) 06/15/2012  . Hyperglycemia 06/15/2012  . Tobacco abuse 06/15/2012  . Chronic back pain 06/15/2012    Past Surgical History:  Procedure Laterality Date  . BACK SURGERY     has had a total of 5 back surgeries    Family History        Family Status  Relation Name Status  . Mother  (Not Specified)  . Father  (Not Specified)        His family history includes Cancer (age of onset: 69) in his mother; Diverticulitis in his mother; Osteoarthritis in his father.      Allergies  Allergen Reactions  . Oxycontin [Oxycodone Hcl] Nausea And Vomiting  . Tylenol With Codeine #3 [Acetaminophen-Codeine] Nausea And Vomiting  . Levaquin [Levofloxacin] Rash and Other (See Comments)    REACTION: ORAL rash (breakout)     Current Outpatient Medications:  .  albuterol (PROVENTIL HFA;VENTOLIN HFA) 108 (90 BASE) MCG/ACT inhaler, Inhale 2 puffs into the lungs every 6 (six) hours as needed for wheezing or shortness of breath., Disp: , Rfl:  .  budesonide-formoterol (SYMBICORT) 160-4.5 MCG/ACT inhaler, Inhale 2 puffs into the lungs 2 (two) times daily., Disp: 1 Inhaler, Rfl: 5 .  nicotine (NICODERM CQ - DOSED IN MG/24 HOURS) 21 mg/24hr patch, Place 1 patch (21 mg total) onto the skin daily. (Patient not taking: Reported on 02/21/2018), Disp: 28 patch, Rfl: 0 .  sertraline (ZOLOFT) 50 MG tablet, Take 1 tablet (50 mg total) by mouth daily. 1/2 tablet for 6 days, then 1 tablet daily, Disp: 30 tablet, Rfl: 3 .  traZODone (DESYREL) 100 MG tablet, Take 1 tablet (100 mg total) by mouth at bedtime as needed for sleep., Disp: 30 tablet, Rfl: 2   Patient Care Team: [name], MD as PCP - General (Family Medicine)      Objective:   Vitals:   BP 118/70 (BP Location: Left Arm) Pulse 76 Temp 97.8 F (36.6 C) (Oral) Ht 5\' 11"  (1.803 m) Wt 314 lb 12.8 oz (142.8 kg)  BMI 43.91 kg/m          Physical Exam   General Appearance:    Alert,  cooperative, no distress, appears stated age, obese  Head:    Normocephalic, without obvious abnormality, atraumatic  Eyes:    PERRL, conjunctiva/corneas clear, EOM's intact, fundi    benign, both eyes       Ears:    Normal TM's and external ear canals, both ears  Nose:   Nares normal, septum midline, mucosa normal, no drainage   or sinus tenderness  Throat:   Lips, mucosa, and tongue normal; teeth and gums normal  Neck:   Supple, symmetrical, trachea midline, no adenopathy;  thyroid:  No enlargement/tenderness/nodules; no carotid   bruit or JVD  Back:     Symmetric, no curvature, ROM normal, no CVA tenderness  Lungs:     Occasional expiratory wheeze, no focal rales or rhonchi, respirations unlabored  Chest wall:    No tenderness or deformity  Heart:    Regular rate and rhythm, S1 and S2 normal, no murmur, rub   or gallop  Abdomen:     Soft, non-tender, bowel sounds active all four quadrants,    no masses, no organomegaly  Genitalia:    deferred  Rectal:    deferred  Extremities:   Extremities normal, atraumatic, no cyanosis or edema  Pulses:   2+ and symmetric all extremities  Skin:   Skin color, texture, turgor normal, no rashes or lesions  Lymph nodes:   Cervical, supraclavicular, and axillary nodes normal  Neurologic:   CNII-XII intact. Normal strength, sensation and reflexes      throughout    Depression Screen PHQ 2/9 Scores 02/21/2018 01/10/2018 01/10/2018  PHQ - 2 Score 6 3 0  PHQ- 9 Score 9 6 -      Assessment & Plan:     Routine Health Maintenance and Physical Exam  Exercise Activities and Dietary recommendations Goals    . Quit Smoking     Recommend to consult PCP for alternative ways to quit smoking to aid in bettering health.         There is no immunization history on file for this patient.  Health Maintenance  Topic Date Due  . Hepatitis C Screening  10/11/1956  . HIV Screening  05/17/1971  . TETANUS/TDAP  05/17/1975  . COLONOSCOPY  05/16/2006    . INFLUENZA VACCINE  05/03/2018 (Originally 07/04/2017)     Discussed health benefits of physical activity, and encouraged him to engage in regular exercise appropriate for his age and condition.    --------------------------------------------------------------------  1. Annual physical exam   2. Pleural effusion Needs to see pulmonary, will contact their office to facilitate referral.   3. Chronic obstructive pulmonary disease, unspecified COPD type ([place]) Doing better with addition of maintenance inhlaer.   4. Insomnia, unspecified type Improved, but not controlled on 100mg  trazodone, will increase to 150mg  daily.  - traZODone (DESYREL) 150 MG tablet; Take 1 tablet (150 mg total) by mouth at bedtime as needed for sleep.  Dispense: 30 tablet; Refill: 5  5. Situational stress Improved on sertraline, but feels he needs to increase dose.  - sertraline (ZOLOFT) 100 MG tablet; Take 1 tablet (100 mg total) by mouth daily.  Dispense: 30 tablet; Refill: 5  6. Visual changes  - Ambulatory referral to Ophthalmology   Lelon Huh, MD  [facility]

## 2018-02-21 NOTE — Patient Instructions (Addendum)
   The CDC recommends two doses of Shingrix (the shingles vaccine) separated by 2 to 6 months for adults age 62 years and older. I recommend checking with your pharmacist regarding coverage for this vaccine.    .You are due for a Tdap (tetanus and whooping cough vaccine.)  I recommend checking with your pharmacist regarding coverage for this vaccine.

## 2018-02-21 NOTE — Progress Notes (Signed)
Subjective:   Andrew Estes is a 62 y.o. male who presents for an Initial Medicare Annual Wellness Visit.  Review of Systems  N/A  Cardiac Risk Factors include: advanced age (>47men, >71 women);dyslipidemia;smoking/ tobacco exposure;male gender;obesity (BMI >30kg/m2)    Objective:    Today's Vitals   02/21/18 0833 02/21/18 0838  BP: 118/70   Pulse: 76   Temp: 97.8 F (36.6 C)   TempSrc: Oral   Weight: (!) 314 lb 12.8 oz (142.8 kg)   Height: 5\' 11"  (1.803 m)   PainSc: 0-No pain 0-No pain   Body mass index is 43.91 kg/m.  Advanced Directives 02/21/2018 04/15/2015 12/09/2014 11/24/2014 11/19/2014 12/31/2013 06/15/2012  Does Patient Have a Medical Advance Directive? No No No No No Patient has advance directive, copy not in chart Patient does not have advance directive  Type of Advance Directive - - - - - Living will -  Does patient want to make changes to medical advance directive? - - - - - No -  Copy of Andrew Estes in Chart? - - - - - Copy requested from family -  Would patient like information on creating a medical advance directive? No - Patient declined Yes - Scientist, clinical (histocompatibility and immunogenetics) given - No - patient declined information No - patient declined information - -  Pre-existing out of facility DNR order (yellow form or pink MOST form) - - - - - No No    Current Medications (verified) Outpatient Encounter Medications as of 02/21/2018  Medication Sig  . budesonide-formoterol (SYMBICORT) 160-4.5 MCG/ACT inhaler Inhale 2 puffs into the lungs 2 (two) times daily.  . [DISCONTINUED] sertraline (ZOLOFT) 50 MG tablet Take 1 tablet (50 mg total) by mouth daily. 1/2 tablet for 6 days, then 1 tablet daily  . [DISCONTINUED] traZODone (DESYREL) 100 MG tablet Take 1 tablet (100 mg total) by mouth at bedtime as needed for sleep.  Marland Kitchen albuterol (PROVENTIL HFA;VENTOLIN HFA) 108 (90 BASE) MCG/ACT inhaler Inhale 2 puffs into the lungs every 6 (six) hours as needed for wheezing or  shortness of breath.  . nicotine (NICODERM CQ - DOSED IN MG/24 HOURS) 21 mg/24hr patch Place 1 patch (21 mg total) onto the skin daily. (Patient not taking: Reported on 02/21/2018)   No facility-administered encounter medications on file as of 02/21/2018.     Allergies (verified) Oxycontin [oxycodone hcl]; Tylenol with codeine #3 [acetaminophen-codeine]; and Levaquin [levofloxacin]   History: Past Medical History:  Diagnosis Date  . Hyperlipidemia   . Obesity   . Renal failure, unspecified 06/15/2012   Past Surgical History:  Procedure Laterality Date  . BACK SURGERY     has had a total of 5 back surgeries   Family History  Problem Relation Age of Onset  . Diverticulitis Mother   . Cancer Mother 70       pancreas  . Osteoarthritis Father    Social History   Socioeconomic History  . Marital status: Single    Spouse name: Not on file  . Number of children: 1  . Years of education: Not on file  . Highest education level: Not on file  Occupational History  . Occupation: Educational psychologist: [employer]    Comment: on workers comp x 2.5 yrs  . Occupation: disability  Social Needs  . Financial resource strain: Very hard  . Food insecurity:    Worry: Never true    Inability: Never true  . Transportation needs:    Medical:  No    Non-medical: No  Tobacco Use  . Smoking status: Current Every Day Smoker    Packs/day: 1.00    Years: 45.00    Pack years: 45.00    Types: Cigarettes  . Smokeless tobacco: Never Used  . Tobacco comment: started smoking at age 89  Substance and Sexual Activity  . Alcohol use: No  . Drug use: No  . Sexual activity: Yes  Lifestyle  . Physical activity:    Days per week: Not on file    Minutes per session: Not on file  . Stress: Very much  Relationships  . Social connections:    Talks on phone: Not on file    Gets together: Not on file    Attends religious service: Not on file    Active member of club or  organization: Not on file    Attends meetings of clubs or organizations: Not on file    Relationship status: Not on file  Other Topics Concern  . Not on file  Social History Narrative  . Not on file   Tobacco Counseling Ready to quit: Yes Counseling given: No Comment: started smoking at age 5   Clinical Intake:  Pre-visit preparation completed: Yes  Pain : No/denies pain Pain Score: 0-No pain     Nutritional Status: BMI > 30  Obese Diabetes: No  How often do you need to have someone help you when you read instructions, pamphlets, or other written materials from your doctor or pharmacy?: 1 - Never  Interpreter Needed?: No  Information entered by :: [facility], LPN  Activities of Daily Living In your present state of health, do you have any difficulty performing the following activities: 02/21/2018  Hearing? N  Vision? N  Difficulty concentrating or making decisions? N  Walking or climbing stairs? N  Dressing or bathing? N  Doing errands, shopping? N  Preparing Food and eating ? N  Using the Toilet? N  In the past six months, have you accidently leaked urine? N  Do you have problems with loss of bowel control? N  Managing your Medications? N  Managing your Finances? N  Housekeeping or managing your Housekeeping? N  Some recent data might be hidden     Immunizations and Health Maintenance  There is no immunization history on file for this patient. Health Maintenance Due  Topic Date Due  . Hepatitis C Screening  1955-12-11  . HIV Screening  05/17/1971  . TETANUS/TDAP  05/17/1975  . COLONOSCOPY  05/16/2006    Patient Care Team: [name], MD as PCP - General (Family Medicine)  Indicate any recent Medical Services you may have received from other than [facility] providers in the past year (date may be approximate).    Assessment:   This is a routine wellness examination for Andrew Estes.  Hearing/Vision screen Vision Screening Comments: Pt does not have  vision checks. Last eye exam was years ago. Pt states he is unable to pay for an eye exam. Referral sent to careguide today.  Current Exercise Habits: The patient does not participate in regular exercise at present, Exercise limited by: Other - see comments(busy as a caregiver)  Goals    . Quit Smoking     Recommend to consult PCP for alternative ways to quit smoking to aid in bettering health.       Depression Screen PHQ 2/9 Scores 02/21/2018 01/10/2018 01/10/2018  PHQ - 2 Score 6 3 0  PHQ- 9 Score 9 6 -  Fall Risk Fall Risk  02/21/2018  Falls in the past year? No    Is the patient's home free of loose throw rugs in walkways, pet beds, electrical cords, etc?   yes      Grab bars in the bathroom? no      Handrails on the stairs?   no      Adequate lighting?   yes  Timed Get Up and Go performed: N/A  Cognitive Function: N/A        Screening Tests Health Maintenance  Topic Date Due  . Hepatitis C Screening  1956-08-03  . HIV Screening  05/17/1971  . TETANUS/TDAP  05/17/1975  . COLONOSCOPY  05/16/2006  . INFLUENZA VACCINE  05/03/2018 (Originally 07/04/2017)    Qualifies for Shingles Vaccine? Due for Shingles vaccine. Declined my offer to administer today. Education has been provided regarding the importance of this vaccine. Pt has been advised to call her insurance company to determine her out of pocket expense. Advised she may also receive this vaccine at her local pharmacy or Health Dept. Verbalized acceptance and understanding.  Cancer Screenings: Lung: Low Dose CT Chest recommended if Age 33-80 years, 30 pack-year currently smoking OR have quit w/in 15years. Patient does qualify, however pt has had scan in 01/2018. Colorectal: Pt declines today.   Additional Screenings:  HIV: Pt declines today.  Hepatitis C Screening: Pt declines today.       Plan:  I have personally reviewed and addressed the Medicare Annual Wellness questionnaire and have noted the following in the  patient's chart:  A. Medical and social history B. Use of alcohol, tobacco or illicit drugs  C. Current medications and supplements D. Functional ability and status E.  Nutritional status F.  Physical activity G. Advance directives H. List of other physicians I.  Hospitalizations, surgeries, and ER visits in previous 12 months J.  [place] such as hearing and vision if needed, cognitive and depression L. Referrals and appointments - none  In addition, I have reviewed and discussed with patient certain preventive protocols, quality metrics, and best practice recommendations. A written personalized care plan for preventive services as well as general preventive health recommendations were provided to patient.  See attached scanned questionnaire for additional information.   Signed,  Fabio Neighbors, LPN Nurse Health Advisor   Nurse Recommendations: Pt declines a colonoscopy referral, tetanus vaccine, HIV and Hep C lab orders today. Referral sent to care guide for financial assistance to set up an eye exam and aid with outstanding bills.

## 2018-02-21 NOTE — Patient Instructions (Addendum)
Mr. Andrew Estes , Thank you for taking time to come for your Medicare Wellness Visit. I appreciate your ongoing commitment to your health goals. Please review the following plan we discussed and let me know if I can assist you in the future.   Screening recommendations/referrals: Colonoscopy: Pt declines today.  Recommended yearly ophthalmology/optometry visit for glaucoma screening and checkup Recommended yearly dental visit for hygiene and checkup  Vaccinations: Influenza vaccine: Pt declines today.  Pneumococcal vaccine: N/A Tdap vaccine: Pt declines today.  Shingles vaccine: Pt declines today.     Advanced directives: Advance directive discussed with you today. Even though you declined this today please call our office should you change your mind and we can give you the proper paperwork for you to fill out.  Conditions/risks identified: Obesity; Recommend to consult PCP for alternative ways to quit smoking to aid in bettering health.   Next appointment: 9:00 AM today with Dr Caryn Section.  Preventive Care 40-64 Years, Male Preventive care refers to lifestyle choices and visits with your health care provider that can promote health and wellness. What does preventive care include?  A yearly physical exam. This is also called an annual well check.  Dental exams once or twice a year.  Routine eye exams. Ask your health care provider how often you should have your eyes checked.  Personal lifestyle choices, including:  Daily care of your teeth and gums.  Regular physical activity.  Eating a healthy diet.  Avoiding tobacco and drug use.  Limiting alcohol use.  Practicing safe sex.  Taking low-dose aspirin every day starting at age 37. What happens during an annual well check? The services and screenings done by your health care provider during your annual well check will depend on your age, overall health, lifestyle risk factors, and family history of disease. Counseling  Your health  care provider may ask you questions about your:  Alcohol use.  Tobacco use.  Drug use.  Emotional well-being.  Home and relationship well-being.  Sexual activity.  Eating habits.  Work and work Statistician. Screening  You may have the following tests or measurements:  Height, weight, and BMI.  Blood pressure.  Lipid and cholesterol levels. These may be checked every 5 years, or more frequently if you are over 35 years old.  Skin check.  Lung cancer screening. You may have this screening every year starting at age 62 if you have a 30-pack-year history of smoking and currently smoke or have quit within the past 15 years.  Fecal occult blood test (FOBT) of the stool. You may have this test every year starting at age 82.  Flexible sigmoidoscopy or colonoscopy. You may have a sigmoidoscopy every 5 years or a colonoscopy every 10 years starting at age 42.  Prostate cancer screening. Recommendations will vary depending on your family history and other risks.  Hepatitis C blood test.  Hepatitis B blood test.  Sexually transmitted disease (STD) testing.  Diabetes screening. This is done by checking your blood sugar (glucose) after you have not eaten for a while (fasting). You may have this done every 1-3 years. Discuss your test results, treatment options, and if necessary, the need for more tests with your health care provider. Vaccines  Your health care provider may recommend certain vaccines, such as:  Influenza vaccine. This is recommended every year.  Tetanus, diphtheria, and acellular pertussis (Tdap, Td) vaccine. You may need a Td booster every 10 years.  Zoster vaccine. You may need this after age 17.  Pneumococcal 13-valent conjugate (PCV13) vaccine. You may need this if you have certain conditions and have not been vaccinated.  Pneumococcal polysaccharide (PPSV23) vaccine. You may need one or two doses if you smoke cigarettes or if you have certain  conditions. Talk to your health care provider about which screenings and vaccines you need and how often you need them. This information is not intended to replace advice given to you by your health care provider. Make sure you discuss any questions you have with your health care provider. Document Released: 12/17/2015 Document Revised: 08/09/2016 Document Reviewed: 09/21/2015 Elsevier Interactive Patient Education  2017 [employer] Prevention in the Home Falls can cause injuries. They can happen to people of all ages. There are many things you can do to make your home safe and to help prevent falls. What can I do on the outside of my home?  Regularly fix the edges of walkways and driveways and fix any cracks.  Remove anything that might make you trip as you walk through a door, such as a raised step or threshold.  Trim any bushes or trees on the path to your home.  Use bright outdoor lighting.  Clear any walking paths of anything that might make someone trip, such as rocks or tools.  Regularly check to see if handrails are loose or broken. Make sure that both sides of any steps have handrails.  Any raised decks and porches should have guardrails on the edges.  Have any leaves, snow, or ice cleared regularly.  Use sand or salt on walking paths during winter.  Clean up any spills in your garage right away. This includes oil or grease spills. What can I do in the bathroom?  Use night lights.  Install grab bars by the toilet and in the tub and shower. Do not use towel bars as grab bars.  Use non-skid mats or decals in the tub or shower.  If you need to sit down in the shower, use a plastic, non-slip stool.  Keep the floor dry. Clean up any water that spills on the floor as soon as it happens.  Remove soap buildup in the tub or shower regularly.  Attach bath mats securely with double-sided non-slip rug tape.  Do not have throw rugs and other things on the floor that  can make you trip. What can I do in the bedroom?  Use night lights.  Make sure that you have a light by your bed that is easy to reach.  Do not use any sheets or blankets that are too big for your bed. They should not hang down onto the floor.  Have a firm chair that has side arms. You can use this for support while you get dressed.  Do not have throw rugs and other things on the floor that can make you trip. What can I do in the kitchen?  Clean up any spills right away.  Avoid walking on wet floors.  Keep items that you use a lot in easy-to-reach places.  If you need to reach something above you, use a strong step stool that has a grab bar.  Keep electrical cords out of the way.  Do not use floor polish or wax that makes floors slippery. If you must use wax, use non-skid floor wax.  Do not have throw rugs and other things on the floor that can make you trip. What can I do with my stairs?  Do not leave any items on the  stairs.  Make sure that there are handrails on both sides of the stairs and use them. Fix handrails that are broken or loose. Make sure that handrails are as long as the stairways.  Check any carpeting to make sure that it is firmly attached to the stairs. Fix any carpet that is loose or worn.  Avoid having throw rugs at the top or bottom of the stairs. If you do have throw rugs, attach them to the floor with carpet tape.  Make sure that you have a light switch at the top of the stairs and the bottom of the stairs. If you do not have them, ask someone to add them for you. What else can I do to help prevent falls?  Wear shoes that:  Do not have high heels.  Have rubber bottoms.  Are comfortable and fit you well.  Are closed at the toe. Do not wear sandals.  If you use a stepladder:  Make sure that it is fully opened. Do not climb a closed stepladder.  Make sure that both sides of the stepladder are locked into place.  Ask someone to hold it for  you, if possible.  Clearly mark and make sure that you can see:  Any grab bars or handrails.  First and last steps.  Where the edge of each step is.  Use tools that help you move around (mobility aids) if they are needed. These include:  Canes.  Walkers.  Scooters.  Crutches.  Turn on the lights when you go into a dark area. Replace any light bulbs as soon as they burn out.  Set up your furniture so you have a clear path. Avoid moving your furniture around.  If any of your floors are uneven, fix them.  If there are any pets around you, be aware of where they are.  Review your medicines with your doctor. Some medicines can make you feel dizzy. This can increase your chance of falling. Ask your doctor what other things that you can do to help prevent falls. This information is not intended to replace advice given to you by your health care provider. Make sure you discuss any questions you have with your health care provider. Document Released: 09/16/2009 Document Revised: 04/27/2016 Document Reviewed: 12/25/2014 Elsevier Interactive Patient Education  2017 [employer].

## 2018-02-25 ENCOUNTER — Encounter: Payer: Self-pay | Admitting: Internal Medicine

## 2018-02-25 ENCOUNTER — Ambulatory Visit (INDEPENDENT_AMBULATORY_CARE_PROVIDER_SITE_OTHER): Payer: Medicare HMO | Admitting: Internal Medicine

## 2018-02-25 VITALS — BP 132/80 | HR 83 | Ht 72.0 in | Wt 316.0 lb

## 2018-02-25 DIAGNOSIS — J90 Pleural effusion, not elsewhere classified: Secondary | ICD-10-CM

## 2018-02-25 DIAGNOSIS — J449 Chronic obstructive pulmonary disease, unspecified: Secondary | ICD-10-CM

## 2018-02-25 MED ORDER — ALBUTEROL SULFATE HFA 108 (90 BASE) MCG/ACT IN AERS: 2.0000 | INHALATION_SPRAY | Freq: Four times a day (QID) | RESPIRATORY_TRACT | 2 refills | Status: DC | PRN

## 2018-02-25 NOTE — Patient Instructions (Signed)
STOP SMOKING  Continue inhalers as prescribed

## 2018-02-25 NOTE — Progress Notes (Signed)
Name: Andrew Estes MRN: [id number] DOB: [birth year]     CONSULTATION DATE: 3.25.19 REFERRING MD : Caryn Section   CHIEF COMPLAINT: Shortness of breath  STUDIES:  CT chest Independently reviewed-2.25.19 Right-sided pleural effusion noted small to moderate  HISTORY OF PRESENT ILLNESS: 62 year old pleasant white male morbidly obese seen today for assessment of COPD Patient is a chronic smoker 1 pack a day for approximately 50 years Currently disabled since 2011 from a broken back  Patient states he was diagnosed with COPD several years ago when he was admitted for pneumonia Patient did not use any inhalers until 1 month ago when his shortness of breath and dyspnea on exertion got worse patient is chronically short of breath Patient had persistent wheezing and coughing patient was started on Symbicort which has tremendously helped his symptoms Patient has no fevers or chills at this time No nausea vomiting diarrhea No signs of infection at this time No signs of congestive heart failure at this time  I have reviewed the CT scan with the patient and I have noted and relayed to the patient that there is a small to moderate effusion which should not we can do a ultrasound-guided thoracentesis via radiology however at this time patient is not in any severe or acute distress and  has refused to undergo any type of thoracentesis at this time  Patient weighs 316 pounds Patient also noted to have a history of kidney dysfunction and is waiting nephrology consultation    PAST MEDICAL HISTORY :   has a past medical history of COPD (chronic obstructive pulmonary disease) ([place]), Hyperlipidemia, Obesity, and Renal failure, unspecified (06/15/2012).  has a past surgical history that includes Back surgery. Prior to Admission medications   Medication Sig Start Date End Date Taking? Authorizing Provider  albuterol (PROVENTIL HFA;VENTOLIN HFA) 108 (90 BASE) MCG/ACT inhaler Inhale 2 puffs into the lungs  every 6 (six) hours as needed for wheezing or shortness of breath.    [provider]  budesonide-formoterol (SYMBICORT) 160-4.5 MCG/ACT inhaler Inhale 2 puffs into the lungs 2 (two) times daily. 01/10/18   [name], MD  sertraline (ZOLOFT) 100 MG tablet Take 1 tablet (100 mg total) by mouth daily. 02/21/18   [name], MD  traZODone (DESYREL) 150 MG tablet Take 1 tablet (150 mg total) by mouth at bedtime as needed for sleep. 02/21/18   [name], MD   Allergies  Allergen Reactions  . Oxycontin [Oxycodone Hcl] Nausea And Vomiting  . Tylenol With Codeine #3 [Acetaminophen-Codeine] Nausea And Vomiting  . Levaquin [Levofloxacin] Rash and Other (See Comments)    REACTION: ORAL rash (breakout)    FAMILY HISTORY:  family history includes Cancer (age of onset: 45) in his mother; Diverticulitis in his mother; Osteoarthritis in his father. SOCIAL HISTORY:  reports that he has been smoking cigarettes.  He has a 45.00 pack-year smoking history. He has never used smokeless tobacco. He reports that he does not drink alcohol or use drugs.  REVIEW OF SYSTEMS:   Constitutional: Negative for fever, chills, weight loss, malaise/fatigue and diaphoresis.  HENT: Negative for hearing loss, ear pain, nosebleeds, congestion, sore throat, neck pain, tinnitus and ear discharge.   Eyes: Negative for blurred vision, double vision, photophobia, pain, discharge and redness.  Respiratory: +shortness of breath, +wheezing and stridor.  +doe 62 year old morbidly obese Cardiovascular: Negative for chest pain, palpitations, orthopnea, claudication, leg swelling and PND.  Gastrointestinal: Negative for heartburn, nausea, vomiting, abdominal pain, diarrhea, constipation, blood in stool  and melena.  Genitourinary: Negative for dysuria, urgency, frequency, hematuria and flank pain.  Musculoskeletal: Negative for myalgias, back pain, joint pain and falls.  Skin: Negative for itching and rash.   Neurological: Negative for dizziness, tingling, tremors, sensory change, speech change, focal weakness, seizures, loss of consciousness, weakness and headaches.  Endo/Heme/Allergies: Negative for environmental allergies and polydipsia. Does not bruise/bleed easily.  ALL OTHER ROS ARE NEGATIVE   BP 132/80 (BP Location: Left Arm, Cuff Size: Normal)   Pulse 83   Ht 6' (1.829 m)   Wt (!) 316 lb (143.3 kg)   SpO2 97%   BMI 42.86 kg/m    Physical Examination:   GENERAL:NAD, no fevers, chills, no weakness no fatigue HEAD: Normocephalic, atraumatic.  EYES: Pupils equal, round, reactive to light. Extraocular muscles intact. No scleral icterus.  MOUTH: Moist mucosal membrane.   EAR, NOSE, THROAT: Clear without exudates. No external lesions.  NECK: Supple. No thyromegaly. No nodules. No JVD.  PULMONARY:CTA B/L no wheezes, no crackles, no rhonchi CARDIOVASCULAR: S1 and S2. Regular rate and rhythm. No murmurs, rubs, or gallops. No edema.  GASTROINTESTINAL: Soft, nontender, nondistended. No masses. Positive bowel sounds.  MUSCULOSKELETAL: No swelling, clubbing, or edema. Range of motion full in all extremities.  NEUROLOGIC: Cranial nerves II through XII are intact. No gross focal neurological deficits.  SKIN: No ulceration, lesions, rashes, or cyanosis. Skin warm and dry. Turgor intact.  PSYCHIATRIC: Mood, affect within normal limits. The patient is awake, alert and oriented x 3. Insight, judgment intact.      ASSESSMENT / PLAN: 63 yo White male with a significant smoking history approximately 1 pack a day for 50 years with a history of chronic shortness of breath and dyspnea on exertion with wheezing and cough in the setting of deconditioned state and obesity patient has signs and symptoms of underlying COPD also with a small to moderate right-sided pleural effusion of unknown etiology however kidney dysfunction is a likely cause  Patient refusing to undergo any type of diagnostic or  therapeutic thoracentesis at this time  #1 shortness of breath dyspnea on exertion is multifactorial related to small to moderate pleural effusion underlying COPD and morbid obesity and deconditioned state  #2 probable COPD Will need to obtain pulmonary function testing Continue Symbicort as prescribed Albuterol as needed  #3 smoking cessation strongly advised 7 minutes of counseling  #4 Obesity -recommend significant weight loss -recommend changing diet  #5 deconditioned state -Recommend increased daily activity and exercise  #6 pleural effusion Etiology is unknown at this time however kidney dysfunction can cause a unilateral pleural effusion Follow-up nephrology consultation  pending I have advised patients of signs and symptoms of worsening shortness of breath pleuritic chest pain or any type of signs of infection  He is to call us soon as possible to assess for chest x-ray and medical therapy   Patient satisfied with Plan of action and management. All questions answered Follow up in 3 months   Massiah Longanecker Patricia Pesa, M.D.  [facility]  Medical Director [place] Director [facility] Cardio-Pulmonary Department

## 2018-03-14 ENCOUNTER — Ambulatory Visit: Payer: Medicare HMO | Attending: Internal Medicine

## 2018-03-14 DIAGNOSIS — J449 Chronic obstructive pulmonary disease, unspecified: Secondary | ICD-10-CM

## 2018-03-14 MED ORDER — ALBUTEROL SULFATE (2.5 MG/3ML) 0.083% IN NEBU
2.5000 mg | INHALATION_SOLUTION | Freq: Once | RESPIRATORY_TRACT | Status: AC
  Administered 2018-03-14: 2.5 mg via RESPIRATORY_TRACT
  Filled 2018-03-14: qty 3

## 2018-03-26 ENCOUNTER — Other Ambulatory Visit: Payer: Self-pay | Admitting: Family Medicine

## 2018-03-26 DIAGNOSIS — J449 Chronic obstructive pulmonary disease, unspecified: Secondary | ICD-10-CM

## 2018-03-26 NOTE — Telephone Encounter (Signed)
Patient needs refills on Symbicort called to [facility] on [address]

## 2018-03-26 NOTE — Telephone Encounter (Addendum)
Per [facility], there are 5 refills available at the pharmacy for Symbicort.

## 2018-03-29 ENCOUNTER — Telehealth: Payer: Self-pay | Admitting: Family Medicine

## 2018-03-29 NOTE — Telephone Encounter (Signed)
Pt denies chest pain, SOB, warmth or coolness to the touch, redness. 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 He also states circulation is WNL. Pt advised to to go to ER if sx worsen, or he experiences any of the sx as listed below.

## 2018-03-29 NOTE — Telephone Encounter (Signed)
Patient called stating he is having burning in his right leg from knee up to thigh when he stands or walks, almost unbearable.  Its not red or hot to touch but it burns on the inside.  He said it is slightly painful when pushing on the side of knee where burning starts.  He wants to wait till tues to be seen but please triage this in case.

## 2018-04-02 ENCOUNTER — Ambulatory Visit (INDEPENDENT_AMBULATORY_CARE_PROVIDER_SITE_OTHER): Payer: Medicare HMO | Admitting: Family Medicine

## 2018-04-02 ENCOUNTER — Encounter: Payer: Self-pay | Admitting: Family Medicine

## 2018-04-02 VITALS — BP 130/80 | HR 72 | Temp 98.5°F | Resp 16 | Ht 72.0 in | Wt 308.0 lb

## 2018-04-02 DIAGNOSIS — M792 Neuralgia and neuritis, unspecified: Secondary | ICD-10-CM | POA: Diagnosis not present

## 2018-04-02 DIAGNOSIS — H401113 Primary open-angle glaucoma, right eye, severe stage: Secondary | ICD-10-CM | POA: Diagnosis not present

## 2018-04-02 MED ORDER — PREDNISONE 10 MG PO TABS: 10.0000 mg | ORAL_TABLET | Freq: Two times a day (BID) | ORAL | 0 refills | Status: AC

## 2018-04-02 NOTE — Progress Notes (Signed)
Patient: Andrew Estes Male    DOB: Sep 16, 1956   62 y.o.   MRN: [id number] Visit Date: 04/02/2018  [name]: Lelon Huh, MD   Chief Complaint  Patient presents with  . Leg Pain   Subjective:    Patient has had pain in right upper leg pain for 1 week. Patient states there was no injury mechanism. Patient states pain is leg worsens the longer he walks on it. Pain described as burning and sharp.   Leg Pain   The incident occurred more than 1 week ago. There was no injury mechanism. The pain is present in the right thigh. The quality of the pain is described as burning and stabbing. The pain is at a severity of 5/10. The pain has been fluctuating since onset. Associated symptoms include an inability to bear weight. He reports no foreign bodies present. The symptoms are aggravated by weight bearing and movement.  He state it started after he woke up in chair one night and his whole right leg was numb. Since then the pain is isolated to his right quad region. It does not reach knee and does not go up into hip or inguinal area.     Allergies  Allergen Reactions  . Oxycontin [Oxycodone Hcl] Nausea And Vomiting  . Tylenol With Codeine #3 [Acetaminophen-Codeine] Nausea And Vomiting  . Levaquin [Levofloxacin] Rash and Other (See Comments)    REACTION: ORAL rash (breakout)     Current Outpatient Medications:  .  albuterol (PROVENTIL HFA;VENTOLIN HFA) 108 (90 BASE) MCG/ACT inhaler, Inhale 2 puffs into the lungs every 6 (six) hours as needed for wheezing or shortness of breath., Disp: , Rfl:  .  albuterol (PROVENTIL HFA;VENTOLIN HFA) 108 (90 Base) MCG/ACT inhaler, Inhale 2 puffs into the lungs every 6 (six) hours as needed for wheezing or shortness of breath., Disp: 1 Inhaler, Rfl: 2 .  budesonide-formoterol (SYMBICORT) 160-4.5 MCG/ACT inhaler, Inhale 2 puffs into the lungs 2 (two) times daily., Disp: 1 Inhaler, Rfl: 5 .  sertraline (ZOLOFT) 100 MG tablet, Take 1 tablet (100  mg total) by mouth daily., Disp: 30 tablet, Rfl: 5 .  traZODone (DESYREL) 150 MG tablet, Take 1 tablet (150 mg total) by mouth at bedtime as needed for sleep., Disp: 30 tablet, Rfl: 5  Review of Systems  Constitutional: Negative for appetite change, chills and fever.  Respiratory: Negative for chest tightness, shortness of breath and wheezing.   Cardiovascular: Negative for chest pain and palpitations.  Gastrointestinal: Negative for abdominal pain, nausea and vomiting.    Social History   Tobacco Use  . Smoking status: Current Every Day Smoker    Packs/day: 1.00    Years: 45.00    Pack years: 45.00    Types: Cigarettes  . Smokeless tobacco: Never Used  . Tobacco comment: started smoking at age 85  Substance Use Topics  . Alcohol use: No   Objective:   BP 130/80 (BP Location: Right Arm, Patient Position: Sitting, Cuff Size: Large)   Pulse 72   Temp 98.5 F (36.9 C) (Oral)   Resp 16   Ht 6' (1.829 m)   Wt (!) 308 lb (139.7 kg)   SpO2 98%   BMI 41.77 kg/m  Vitals:   04/02/18 1037  BP: 130/80  Pulse: 72  Resp: 16  Temp: 98.5 F (36.9 C)  TempSrc: Oral  SpO2: 98%  Weight: (!) 308 lb (139.7 kg)  Height: 6' (1.829 m)     Physical  Exam  General Appearance:    Alert, cooperative, no distress, obese  Eyes:    PERRL, conjunctiva/corneas clear, EOM's intact       Lungs:     Clear to auscultation bilaterally, respirations unlabored  Heart:    Regular rate and rhythm  Neurologic:   Awake, alert, oriented x 3. No apparent focal neurological           defect. Tender deep palpation of right quadriceps. No pain with knee or hip extension against resistance.          Assessment & Plan:     1. Neuralgia Likely femoral nerve irritation. He cannot take NSAIDs due to CKD. Does not tolerate high doses of prednisone, so will try low doses of prednisone for a few weeks.  - predniSONE (DELTASONE) 10 MG tablet; Take 1 tablet (10 mg total) by mouth 2 (two) times daily with a meal  for 15 days.  Dispense: 30 tablet; Refill: 0       Lelon Huh, MD  [facility]

## 2018-04-03 ENCOUNTER — Telehealth: Payer: Self-pay | Admitting: Family Medicine

## 2018-04-03 NOTE — Telephone Encounter (Signed)
Pt called stating he received a call from someone.  I couldn't find a message however the patient stated the Wellness nurse was supposed to be reaching out to someone to get him help.  (Care Mgmt Referral).  He is requesting a call back.

## 2018-04-04 DIAGNOSIS — H401113 Primary open-angle glaucoma, right eye, severe stage: Secondary | ICD-10-CM | POA: Diagnosis not present

## 2018-04-04 NOTE — Telephone Encounter (Signed)
Pt called asking to speak with someone about the referral he requested. Pt stated that he couldn't understand the message that was left and requested a call back tomorrow morning at [phone]. Please advise. Thanks TNP

## 2018-04-05 NOTE — Telephone Encounter (Signed)
I am still waiting on a call back from patient.

## 2018-05-13 DIAGNOSIS — H401113 Primary open-angle glaucoma, right eye, severe stage: Secondary | ICD-10-CM | POA: Diagnosis not present

## 2018-06-10 ENCOUNTER — Other Ambulatory Visit: Payer: Self-pay | Admitting: Family Medicine

## 2018-06-10 DIAGNOSIS — F439 Reaction to severe stress, unspecified: Secondary | ICD-10-CM

## 2018-06-10 DIAGNOSIS — G4700 Insomnia, unspecified: Secondary | ICD-10-CM

## 2018-06-11 ENCOUNTER — Encounter: Payer: Self-pay | Admitting: Family Medicine

## 2018-06-11 DIAGNOSIS — G4730 Sleep apnea, unspecified: Secondary | ICD-10-CM | POA: Insufficient documentation

## 2018-08-20 ENCOUNTER — Other Ambulatory Visit: Payer: Self-pay | Admitting: Family Medicine

## 2018-08-20 DIAGNOSIS — F439 Reaction to severe stress, unspecified: Secondary | ICD-10-CM

## 2018-08-20 DIAGNOSIS — G4700 Insomnia, unspecified: Secondary | ICD-10-CM

## 2018-09-11 DIAGNOSIS — E119 Type 2 diabetes mellitus without complications: Secondary | ICD-10-CM | POA: Diagnosis not present

## 2018-09-11 DIAGNOSIS — H5213 Myopia, bilateral: Secondary | ICD-10-CM | POA: Diagnosis not present

## 2018-11-18 DIAGNOSIS — H401113 Primary open-angle glaucoma, right eye, severe stage: Secondary | ICD-10-CM | POA: Diagnosis not present

## 2018-11-19 DIAGNOSIS — H401113 Primary open-angle glaucoma, right eye, severe stage: Secondary | ICD-10-CM | POA: Diagnosis not present

## 2018-12-10 ENCOUNTER — Ambulatory Visit (INDEPENDENT_AMBULATORY_CARE_PROVIDER_SITE_OTHER): Payer: Medicare HMO | Admitting: Family Medicine

## 2018-12-10 ENCOUNTER — Encounter: Payer: Self-pay | Admitting: Family Medicine

## 2018-12-10 VITALS — BP 128/68 | HR 76 | Temp 98.3°F | Resp 16 | Wt 272.0 lb

## 2018-12-10 DIAGNOSIS — G4700 Insomnia, unspecified: Secondary | ICD-10-CM | POA: Diagnosis not present

## 2018-12-10 DIAGNOSIS — L03113 Cellulitis of right upper limb: Secondary | ICD-10-CM | POA: Diagnosis not present

## 2018-12-10 DIAGNOSIS — N183 Chronic kidney disease, stage 3 unspecified: Secondary | ICD-10-CM

## 2018-12-10 DIAGNOSIS — F439 Reaction to severe stress, unspecified: Secondary | ICD-10-CM

## 2018-12-10 MED ORDER — TRAZODONE HCL 150 MG PO TABS: 225.0000 mg | ORAL_TABLET | Freq: Every evening | ORAL | 5 refills | Status: DC | PRN

## 2018-12-10 MED ORDER — DOXYCYCLINE HYCLATE 100 MG PO TABS: 100.0000 mg | ORAL_TABLET | Freq: Two times a day (BID) | ORAL | 0 refills | Status: DC

## 2018-12-10 MED ORDER — SERTRALINE HCL 100 MG PO TABS: ORAL_TABLET | ORAL | 5 refills | Status: DC

## 2018-12-10 NOTE — Progress Notes (Signed)
Patient: Andrew Estes Male    DOB: 1956-01-28   63 y.o.   MRN: [id number] Visit Date: 12/10/2018  [name]: Lelon Huh, MD   Chief Complaint  Patient presents with  . Abscess    Started about three weeks ago.   Subjective:     HPI    Abscess: Patient presents for evaluation of a cutaneous abscess. Lesion is located in the left axilla, also right lower leg. Onset was 3 weeks ago. Symptoms have gradually worsened. Abscess has associated symptoms of spontaneous drainage, pain. 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 Patient does not have diabetes.    Allergies  Allergen Reactions  . Oxycontin [Oxycodone Hcl] Nausea And Vomiting  . Tylenol With Codeine #3 [Acetaminophen-Codeine] Nausea And Vomiting  . Levaquin [Levofloxacin] Rash and Other (See Comments)    REACTION: ORAL rash (breakout)     Current Outpatient Medications:  .  albuterol (PROVENTIL HFA;VENTOLIN HFA) 108 (90 BASE) MCG/ACT inhaler, Inhale 2 puffs into the lungs every 6 (six) hours as needed for wheezing or shortness of breath., Disp: , Rfl:  .  budesonide-formoterol (SYMBICORT) 160-4.5 MCG/ACT inhaler, Inhale 2 puffs into the lungs 2 (two) times daily., Disp: 1 Inhaler, Rfl: 5 .  sertraline (ZOLOFT) 100 MG tablet, TAKE 1 TABLET(100 MG) BY MOUTH DAILY, Disp: 30 tablet, Rfl: 5 .  traZODone (DESYREL) 150 MG tablet, TAKE 1 TABLET(150 MG) BY MOUTH AT BEDTIME AS NEEDED FOR SLEEP, Disp: 30 tablet, Rfl: 5 .  albuterol (PROVENTIL HFA;VENTOLIN HFA) 108 (90 Base) MCG/ACT inhaler, Inhale 2 puffs into the lungs every 6 (six) hours as needed for wheezing or shortness of breath., Disp: 1 Inhaler, Rfl: 2  Review of Systems  Constitutional: Negative for appetite change, chills and fever.  Respiratory: Negative for chest tightness, shortness of breath and wheezing.   Cardiovascular: Negative for chest pain and palpitations.  Gastrointestinal: Negative for abdominal pain, nausea and vomiting.     Social History   Tobacco Use  . Smoking status: Current Every Day Smoker    Packs/day: 1.00    Years: 45.00    Pack years: 45.00    Types: Cigarettes  . Smokeless tobacco: Never Used  . Tobacco comment: started smoking at age 67  Substance Use Topics  . Alcohol use: No      Objective:   BP 128/68 (BP Location: Right Arm, Patient Position: Sitting, Cuff Size: Large)   Pulse 76   Temp 98.3 F (36.8 C) (Oral)   Resp 16   Wt 272 lb (123.4 kg)   BMI 36.89 kg/m  Vitals:   12/10/18 1447  BP: 128/68  Pulse: 76  Resp: 16  Temp: 98.3 F (36.8 C)  TempSrc: Oral  Weight: 272 lb (123.4 kg)     Physical Exam  General appearance: alert, well developed, well nourished, cooperative and in no distress Head: Normocephalic, without obvious abnormality, atraumatic Respiratory: Respirations even and unlabored, normal respiratory rate Extremities: Skin lesions with scant seepage as described per HPI.       Assessment & Plan    1. Cellulitis of right upper extremity Cover for MRSA while awaiting culture results.  - doxycycline (VIBRA-TABS) 100 MG tablet; Take 1 tablet (100 mg total) by mouth 2 (two) times daily for 10 days.  Dispense: 20 tablet; Refill: 0 - Aerobic Culture - Specimen status report  2. Situational stress Doing well on current medications, refill- sertraline (ZOLOFT) 100 MG tablet; TAKE 1-2 TABLETS(100 MG) BY MOUTH  DAILY  Dispense: 60 tablet; Refill: 5  3. Insomnia, unspecified type refill- traZODone (DESYREL) 150 MG tablet; Take 1.5 tablets (225 mg total) by mouth at bedtime as needed for sleep.  Dispense: 45 tablet; Refill: 5  4. Chronic kidney disease (CKD), stage III (moderate) (HCC) He is overdue for follow up with nephology. Will check- Renal function panel     Lelon Huh, MD  [facility]

## 2018-12-10 NOTE — Patient Instructions (Signed)
.   Please bring all of your medications to every appointment so we can make sure that our medication list is the same as yours.   

## 2018-12-13 ENCOUNTER — Telehealth: Payer: Self-pay

## 2018-12-13 LAB — AEROBIC CULTURE

## 2018-12-13 LAB — SPECIMEN STATUS REPORT

## 2018-12-13 NOTE — Telephone Encounter (Signed)
-----   Message from [name], MD sent at 12/13/2018  3:01 PM EST ----- Culture grew stphy aureus sensitive to doxycycline. Need to finish antibiotic and follow up in 7-10 days.

## 2018-12-13 NOTE — Telephone Encounter (Signed)
Patient advised as below.  

## 2018-12-13 NOTE — Telephone Encounter (Signed)
Patient called wanting to know the results of his wound culture. Please advise. Thanks!

## 2018-12-20 ENCOUNTER — Encounter: Payer: Self-pay | Admitting: Family Medicine

## 2018-12-20 ENCOUNTER — Ambulatory Visit (INDEPENDENT_AMBULATORY_CARE_PROVIDER_SITE_OTHER): Payer: Medicare HMO | Admitting: Family Medicine

## 2018-12-20 DIAGNOSIS — L309 Dermatitis, unspecified: Secondary | ICD-10-CM | POA: Diagnosis not present

## 2018-12-20 DIAGNOSIS — L03113 Cellulitis of right upper limb: Secondary | ICD-10-CM

## 2018-12-20 MED ORDER — DOXYCYCLINE HYCLATE 100 MG PO TABS: 100.0000 mg | ORAL_TABLET | Freq: Two times a day (BID) | ORAL | 0 refills | Status: AC

## 2018-12-20 NOTE — Progress Notes (Signed)
Patient: Andrew Estes Male    DOB: 31-Oct-1956   63 y.o.   MRN: [id number] Visit Date: 12/20/2018  [name]: Lelon Huh, MD   Chief Complaint  Patient presents with  . Cellulitis   Subjective:     HPI   Abscess: Patient presents for evaluation of a cutaneous abscess. Lesion is located in the left axilla. Onset was 1 month ago. Symptoms have gradually improved. Pt finished the prescription for Doxycycline.  He states the abscess has improved but in not completely gone.  Abscess has associated symptoms of spontaneous drainage, pain. Patient does not have previous history of cutaneous abscesses. Patient does not have diabetes.  He also complains of very dry flaky skin getting worse the last few months. Uses Oil of Olay soap and OTC Gold Bond cream   Allergies  Allergen Reactions  . Oxycontin [Oxycodone Hcl] Nausea And Vomiting  . Tylenol With Codeine #3 [Acetaminophen-Codeine] Nausea And Vomiting  . Levaquin [Levofloxacin] Rash and Other (See Comments)    REACTION: ORAL rash (breakout)     Current Outpatient Medications:  .  albuterol (PROVENTIL HFA;VENTOLIN HFA) 108 (90 Base) MCG/ACT inhaler, Inhale 2 puffs into the lungs every 6 (six) hours as needed for wheezing or shortness of breath., Disp: 1 Inhaler, Rfl: 2 .  budesonide-formoterol (SYMBICORT) 160-4.5 MCG/ACT inhaler, Inhale 2 puffs into the lungs 2 (two) times daily., Disp: 1 Inhaler, Rfl: 5 .  doxycycline (VIBRA-TABS) 100 MG tablet, Take 1 tablet (100 mg total) by mouth 2 (two) times daily for 10 days., Disp: 20 tablet, Rfl: 0 .  sertraline (ZOLOFT) 100 MG tablet, TAKE 1-2 TABLETS(100 MG) BY MOUTH DAILY, Disp: 60 tablet, Rfl: 5 .  traZODone (DESYREL) 150 MG tablet, Take 1.5 tablets (225 mg total) by mouth at bedtime as needed for sleep., Disp: 45 tablet, Rfl: 5  Review of Systems  Constitutional: Negative.   Skin: Positive for wound. Negative for color change, pallor and rash.    Social History    Tobacco Use  . Smoking status: Current Every Day Smoker    Packs/day: 1.00    Years: 45.00    Pack years: 45.00    Types: Cigarettes  . Smokeless tobacco: Never Used  . Tobacco comment: started smoking at age 30  Substance Use Topics  . Alcohol use: No      Objective:   BP 126/70 (BP Location: Right Arm, Patient Position: Sitting, Cuff Size: Large)   Pulse 76   Temp 98 F (36.7 C) (Oral)   Resp 16   Wt 275 lb (124.7 kg)   BMI 37.30 kg/m     Physical Exam  General appearance: alert, well developed, well nourished, cooperative and in no distress Head: Normocephalic, without obvious abnormality, atraumatic Respiratory: Respirations even and unlabored, normal respiratory rate   Skin: Dry flaky skin both forearms with psoriatic patch over posterior elbows.  Dime Size healing lesion left proxima inner arm (see previous note) with very slight tenderness and fullness underlying tissue. Lesion is dry.  Psych: Appropriate mood and affect. Neurologic: Mental status: Alert, oriented to person, place, and time, thought content appropriate.     Assessment & Plan    1. Cellulitis of right upper extremity Nearly resolved. refill- doxycycline (VIBRA-TABS) 100 MG tablet; Take 1 tablet (100 mg total) by mouth 2 (two) times daily for 7 days.  Dispense: 14 tablet; Refill: 0 no follow up unless lesions return.   2. Eczema, unspecified type Counseled on skin  moisturizers such as Eucerin and occasional use of OTC topical steroids. He likely has some psoriasis. Has seen dermatology in past and can call for referral if not improving with skin moisturizers.   Patient has not yet done last ordered at least visit. Lab order reprinted and advised to have drawn today.     Lelon Huh, MD  [facility]

## 2018-12-20 NOTE — Patient Instructions (Signed)
.   Please bring all of your medications to every appointment so we can make sure that our medication list is the same as yours.   . Please go to the lab draw station in [address] on the second floor of [facility]

## 2018-12-26 ENCOUNTER — Telehealth: Payer: Self-pay | Admitting: Family Medicine

## 2018-12-26 NOTE — Telephone Encounter (Signed)
-----   Message from [name], MD sent at 12/20/2018  9:02 AM EST ----- Regarding: make sure he does labs orders first of january

## 2018-12-26 NOTE — Telephone Encounter (Signed)
Please check with patient and see when he is going to get his labs done. It is very important that we keep a check on his kidney functions, otherwise he may end up on dialysis.

## 2018-12-26 NOTE — Telephone Encounter (Signed)
Reminded pt that he is due for lab work.  He says is has a bad cold right now but will get it done soon.   Thanks,    -[name]

## 2019-01-09 NOTE — Telephone Encounter (Signed)
LMTCB 01/09/2019  Thanks,   -[name]

## 2019-01-09 NOTE — Telephone Encounter (Signed)
Please check with patient to see if he's done labs yet. We still haven't gotten results.

## 2019-01-14 NOTE — Telephone Encounter (Signed)
Pt advised.  Pt wanted to make an appointment for tomorrow also.  He states he has a lot of congestion.  Thanks,   -[name]

## 2019-01-15 ENCOUNTER — Ambulatory Visit (INDEPENDENT_AMBULATORY_CARE_PROVIDER_SITE_OTHER): Payer: Medicare HMO | Admitting: Family Medicine

## 2019-01-15 ENCOUNTER — Ambulatory Visit
Admission: RE | Admit: 2019-01-15 | Discharge: 2019-01-15 | Disposition: A | Discharge status: Home / Self Care | Payer: Medicare HMO | Source: Ambulatory Visit | Attending: Family Medicine | Admitting: Family Medicine

## 2019-01-15 ENCOUNTER — Ambulatory Visit
Admission: RE | Admit: 2019-01-15 | Discharge: 2019-01-15 | Disposition: A | Discharge status: Home / Self Care | Payer: Medicare HMO | Attending: Family Medicine | Admitting: Family Medicine

## 2019-01-15 ENCOUNTER — Encounter: Payer: Self-pay | Admitting: Family Medicine

## 2019-01-15 VITALS — BP 102/60 | HR 85 | Temp 98.7°F | Resp 24 | Wt 274.0 lb

## 2019-01-15 DIAGNOSIS — R509 Fever, unspecified: Secondary | ICD-10-CM

## 2019-01-15 DIAGNOSIS — R059 Cough, unspecified: Secondary | ICD-10-CM

## 2019-01-15 DIAGNOSIS — N183 Chronic kidney disease, stage 3 (moderate): Secondary | ICD-10-CM | POA: Diagnosis not present

## 2019-01-15 DIAGNOSIS — R05 Cough: Secondary | ICD-10-CM | POA: Diagnosis not present

## 2019-01-15 NOTE — Patient Instructions (Signed)
Go to the [facility] on [address] for chest Xray   Go to the Emergency if your breathing gets any worse or if you have any confusion or fever of 102

## 2019-01-15 NOTE — Progress Notes (Signed)
Patient: Andrew Estes Male    DOB: July 23, 1956   63 y.o.   MRN: [id number] Visit Date: 01/15/2019  [name]: Lelon Huh, MD   Chief Complaint  Patient presents with  . URI    Symptoms started about two days ago.   Subjective:     URI   This is a new problem. The current episode started in the past 7 days. The problem has been gradually worsening. The maximum temperature recorded prior to his arrival was 101 - 101.9 F. The fever has been present for less than 1 day. Associated symptoms include congestion, coughing, headaches, rhinorrhea, sneezing, a sore throat and wheezing. Pertinent negatives include no abdominal pain, chest pain, diarrhea, dysuria, ear pain, joint pain, joint swelling, nausea, neck pain, plugged ear sensation, rash, sinus pain, swollen glands or vomiting.  He reports two kids in the house tested positive for flu this week.     Allergies  Allergen Reactions  . Oxycontin [Oxycodone Hcl] Nausea And Vomiting  . Tylenol With Codeine #3 [Acetaminophen-Codeine] Nausea And Vomiting  . Levofloxacin Rash and Other (See Comments)    REACTION: ORAL rash (breakout) "caused right eye vision problems and taste bud problem"     Current Outpatient Medications:  .  albuterol (PROVENTIL HFA;VENTOLIN HFA) 108 (90 Base) MCG/ACT inhaler, Inhale 2 puffs into the lungs every 6 (six) hours as needed for wheezing or shortness of breath., Disp: 1 Inhaler, Rfl: 2 .  budesonide-formoterol (SYMBICORT) 160-4.5 MCG/ACT inhaler, Inhale 2 puffs into the lungs 2 (two) times daily., Disp: 1 Inhaler, Rfl: 5 .  sertraline (ZOLOFT) 100 MG tablet, TAKE 1-2 TABLETS(100 MG) BY MOUTH DAILY, Disp: 60 tablet, Rfl: 5 .  traZODone (DESYREL) 150 MG tablet, Take 1.5 tablets (225 mg total) by mouth at bedtime as needed for sleep., Disp: 45 tablet, Rfl: 5  Review of Systems  Constitutional: Positive for chills, diaphoresis, fatigue and fever. Negative for activity change, appetite change  and unexpected weight change.  HENT: Positive for congestion, postnasal drip, rhinorrhea, sneezing and sore throat. Negative for ear discharge, ear pain, sinus pressure, sinus pain and trouble swallowing.   Eyes: Negative.   Respiratory: Positive for cough, shortness of breath and wheezing. Negative for apnea, choking, chest tightness and stridor.   Cardiovascular: Negative for chest pain.  Gastrointestinal: Negative.  Negative for abdominal pain, diarrhea, nausea and vomiting.  Genitourinary: Negative for dysuria.  Musculoskeletal: Negative for joint pain and neck pain.  Skin: Negative for rash.  Neurological: Positive for headaches. Negative for dizziness and light-headedness.    Social History   Tobacco Use  . Smoking status: Current Every Day Smoker    Packs/day: 1.00    Years: 45.00    Pack years: 45.00    Types: Cigarettes  . Smokeless tobacco: Never Used  . Tobacco comment: started smoking at age 11  Substance Use Topics  . Alcohol use: No      Objective:   BP 102/60 (BP Location: Right Arm, Patient Position: Sitting, Cuff Size: Large)   Pulse 85   Temp 98.7 F (37.1 C) (Oral)   Resp (!) 24   Wt 274 lb (124.3 kg)   SpO2 95%   BMI 37.16 kg/m  Vitals:   01/15/19 1346  BP: 102/60  Pulse: 85  Resp: (!) 24  Temp: 98.7 F (37.1 C)  TempSrc: Oral  SpO2: 95%  Weight: 274 lb (124.3 kg)    Physical Exam  General Appearance:  Alert, cooperative, no distress  HENT:   ENT exam normal, no neck nodes or sinus tenderness and sinuses nontender  Eyes:    PERRL, conjunctiva/corneas clear, EOM's intact       Lungs:     Occasional expiratory wheeze, no rales, , respirations unlabored  Heart:    Regular rate and rhythm  Neurologic:   Awake, alert, oriented x 3. No apparent focal neurological           defect.          Assessment & Plan    1. Cough  - DG Chest 2 View; Future  2. Fever, unspecified fever cause  - DG Chest 2 View; Future  He reports he has no  money to fill any prescriptions until next Wednesday.  He was given 1,000 mg of ceftriaxone IM in the office today to cover for bronchitis and pneumonia while awaiting results of chest Xray. He did have household exposure to influenza, but he has no money for Tamiflu prescription and we have no samples in office.      Lelon Huh, MD  [facility]

## 2019-01-16 ENCOUNTER — Telehealth: Payer: Self-pay

## 2019-01-16 LAB — RENAL FUNCTION PANEL
Albumin: 4.1 g/dL (ref 3.8–4.8)
BUN/Creatinine Ratio: 7 — ABNORMAL LOW (ref 10–24)
BUN: 15 mg/dL (ref 8–27)
CO2: 20 mmol/L (ref 20–29)
Calcium: 9 mg/dL (ref 8.6–10.2)
Chloride: 103 mmol/L (ref 96–106)
Creatinine, Ser: 2.16 mg/dL — ABNORMAL HIGH (ref 0.76–1.27)
GFR calc Af Amer: 37 mL/min/{1.73_m2} — ABNORMAL LOW (ref >59)
GFR calc non Af Amer: 32 mL/min/{1.73_m2} — ABNORMAL LOW (ref >59)
GLUCOSE: 79 mg/dL (ref 65–99)
Phosphorus: 3.3 mg/dL (ref 2.8–4.1)
Potassium: 4.6 mmol/L (ref 3.5–5.2)
Sodium: 141 mmol/L (ref 134–144)

## 2019-01-16 MED ORDER — AZITHROMYCIN 250 MG PO TABS: ORAL_TABLET | ORAL | 0 refills | Status: DC

## 2019-01-16 NOTE — Telephone Encounter (Signed)
Tried calling patient. Left message to call back. 

## 2019-01-16 NOTE — Telephone Encounter (Signed)
Pt returned missed call. Please call pt back for results and labs at his home number.  Thanks, [employer]

## 2019-01-16 NOTE — Telephone Encounter (Signed)
-----   Message from [name], MD sent at 01/16/2019  9:56 AM EST ----- Xray shows some emphysema, but no pneumonia. Recommend he start zpac for bronchitis. Ok to send prescription to his pharmacy Also, kidney functions slightly worse. He needs to drink more water. Will need to see nephrologist at some point, but for now we can just monitor here. Need to schedule follow up 3-4 months.

## 2019-01-16 NOTE — Telephone Encounter (Signed)
Pt advised.  Apt made for 04/30/2019, RX sent to [facility].   Thanks,   -[name]

## 2019-04-30 ENCOUNTER — Ambulatory Visit: Payer: Medicare HMO | Admitting: Family Medicine

## 2019-05-12 ENCOUNTER — Encounter: Payer: Self-pay | Admitting: Family Medicine

## 2019-05-12 ENCOUNTER — Other Ambulatory Visit: Payer: Self-pay

## 2019-05-12 ENCOUNTER — Ambulatory Visit (INDEPENDENT_AMBULATORY_CARE_PROVIDER_SITE_OTHER): Payer: Medicare HMO | Admitting: Family Medicine

## 2019-05-12 VITALS — BP 132/80 | HR 80 | Temp 100.0°F | Resp 16 | Wt 257.0 lb

## 2019-05-12 DIAGNOSIS — G4700 Insomnia, unspecified: Secondary | ICD-10-CM

## 2019-05-12 DIAGNOSIS — N183 Chronic kidney disease, stage 3 unspecified: Secondary | ICD-10-CM

## 2019-05-12 MED ORDER — TRAZODONE HCL 300 MG PO TABS: 300.0000 mg | ORAL_TABLET | Freq: Every evening | ORAL | 1 refills | Status: DC | PRN

## 2019-05-12 NOTE — Patient Instructions (Signed)
.   Please review the attached list of medications and notify my office if there are any errors.   . Please bring all of your medications to every appointment so we can make sure that our medication list is the same as yours.   

## 2019-05-12 NOTE — Progress Notes (Signed)
Patient: Andrew Estes Male    DOB: 23-Dec-1955   63 y.o.   MRN: [id number] Visit Date: 05/12/2019  [name]: Lelon Huh, MD   Chief Complaint  Patient presents with  . Chronic Kidney Disease   Subjective:     HPI  Follow up for CKD  The patient was last seen for this 3 months ago. Changes made at last visit include none; labs were checked showing kidney functions were slightly worse. Patient was advised to drink more water and follow up in 3-4 months.  BMET    Component Value Date/Time   NA 141 01/15/2019 1421   K 4.6 01/15/2019 1421   CL 103 01/15/2019 1421   CO2 20 01/15/2019 1421   GLUCOSE 79 01/15/2019 1421   GLUCOSE 90 11/24/2014 1937   BUN 15 01/15/2019 1421   CREATININE 2.16 (H) 01/15/2019 1421   CALCIUM 9.0 01/15/2019 1421   GFRNONAA 32 (L) 01/15/2019 1421   GFRAA 37 (L) 01/15/2019 1421    He reports good compliance with treatment. He feels that condition is Unchanged. He is not having side effects.   ------------------------------------------------------------------------------------  Taking 1 1/2 trazodone 150 tablets every night, states was changed to an oval pill that doesn't work as well as the triangular pill.   He states that anxiety level is much better, feels like sertraline 200mg  a day is working well, and everyone has moved out of the house except his father which has really helped with stress.   Allergies  Allergen Reactions  . Oxycontin [Oxycodone Hcl] Nausea And Vomiting  . Tylenol With Codeine #3 [Acetaminophen-Codeine] Nausea And Vomiting  . Levofloxacin Rash and Other (See Comments)    REACTION: ORAL rash (breakout) "caused right eye vision problems and taste bud problem"     Current Outpatient Medications:  .  albuterol (PROVENTIL HFA;VENTOLIN HFA) 108 (90 Base) MCG/ACT inhaler, Inhale 2 puffs into the lungs every 6 (six) hours as needed for wheezing or shortness of breath., Disp: 1 Inhaler, Rfl: 2 .   budesonide-formoterol (SYMBICORT) 160-4.5 MCG/ACT inhaler, Inhale 2 puffs into the lungs 2 (two) times daily., Disp: 1 Inhaler, Rfl: 5 .  sertraline (ZOLOFT) 100 MG tablet, TAKE 1-2 TABLETS(100 MG) BY MOUTH DAILY, Disp: 60 tablet, Rfl: 5 .  traZODone (DESYREL) 150 MG tablet, Take 1.5 tablets (225 mg total) by mouth at bedtime as needed for sleep., Disp: 45 tablet, Rfl: 5  Review of Systems  Constitutional: Negative for appetite change, chills and fever.  Respiratory: Negative for chest tightness, shortness of breath and wheezing.   Cardiovascular: Negative for chest pain and palpitations.  Gastrointestinal: Negative for abdominal pain, nausea and vomiting.    Social History   Tobacco Use  . Smoking status: Current Every Day Smoker    Packs/day: 1.00    Years: 45.00    Pack years: 45.00    Types: Cigarettes  . Smokeless tobacco: Never Used  . Tobacco comment: started smoking at age 41  Substance Use Topics  . Alcohol use: No      Objective:   BP 132/80 (BP Location: Left Arm, Patient Position: Sitting, Cuff Size: Large)   Pulse 80   Temp 100 F (37.8 C) (Oral)   Resp 16   Wt 257 lb (116.6 kg)   SpO2 95% Comment: room air  BMI 34.86 kg/m  Vitals:   05/12/19 1033  BP: 132/80  Pulse: 80  Resp: 16  Temp: 100 F (37.8 C)  TempSrc: Oral  SpO2: 95%  Weight: 257 lb (116.6 kg)     Physical Exam  General Appearance:    Alert, cooperative, no distress, obese  Eyes:    PERRL, conjunctiva/corneas clear, EOM's intact       Lungs:     Clear to auscultation bilaterally, respirations unlabored  Heart:    Regular rate and rhythm  Neurologic:   Awake, alert, oriented x 3. No apparent focal neurological           defect.          Assessment & Plan    1. Insomnia, unspecified type increase- traZODone (DESYREL) to 300 MG tablet; Take 1 tablet (300 mg total) by mouth at bedtime as needed for sleep.  Dispense: 90 tablet; Refill: 1  2. Chronic kidney disease (CKD), stage III  (moderate) (HCC)  - Renal function panel     The entirety of the information documented in the History of Present Illness, Review of Systems and Physical Exam were personally obtained by me. Portions of this information were initially documented by Meyer Cory, CMA and reviewed by me for thoroughness and accuracy.   Lelon Huh, MD  [facility]

## 2019-05-13 ENCOUNTER — Telehealth: Payer: Self-pay

## 2019-05-13 LAB — RENAL FUNCTION PANEL
Albumin: 4 g/dL (ref 3.8–4.8)
BUN/Creatinine Ratio: 7 — ABNORMAL LOW (ref 10–24)
BUN: 14 mg/dL (ref 8–27)
CO2: 22 mmol/L (ref 20–29)
Calcium: 9 mg/dL (ref 8.6–10.2)
Chloride: 105 mmol/L (ref 96–106)
Creatinine, Ser: 1.97 mg/dL — ABNORMAL HIGH (ref 0.76–1.27)
GFR calc Af Amer: 41 mL/min/{1.73_m2} — ABNORMAL LOW (ref >59)
GFR calc non Af Amer: 35 mL/min/{1.73_m2} — ABNORMAL LOW (ref >59)
Glucose: 79 mg/dL (ref 65–99)
Phosphorus: 3.1 mg/dL (ref 2.8–4.1)
Potassium: 4.2 mmol/L (ref 3.5–5.2)
Sodium: 141 mmol/L (ref 134–144)

## 2019-05-13 NOTE — Telephone Encounter (Signed)
LMTCB 05/13/2019  Thanks,   -[name]

## 2019-05-13 NOTE — Telephone Encounter (Signed)
-----   Message from [name], MD sent at 05/13/2019  7:13 AM EDT ----- Kidney functions a little better. Try to drink 2-3 more glasses of water every day. Schedule follow up 4-5 months.

## 2019-05-16 NOTE — Telephone Encounter (Signed)
Pt advised.   Apt made for 09/30/2019 at 9:40 am   Thanks,   -[name]

## 2019-06-23 ENCOUNTER — Other Ambulatory Visit: Payer: Self-pay | Admitting: Family Medicine

## 2019-06-23 DIAGNOSIS — J449 Chronic obstructive pulmonary disease, unspecified: Secondary | ICD-10-CM

## 2019-07-20 ENCOUNTER — Other Ambulatory Visit: Payer: Self-pay | Admitting: Family Medicine

## 2019-07-20 DIAGNOSIS — F439 Reaction to severe stress, unspecified: Secondary | ICD-10-CM

## 2019-09-10 DIAGNOSIS — H401113 Primary open-angle glaucoma, right eye, severe stage: Secondary | ICD-10-CM | POA: Diagnosis not present

## 2019-09-24 DIAGNOSIS — H401121 Primary open-angle glaucoma, left eye, mild stage: Secondary | ICD-10-CM | POA: Diagnosis not present

## 2019-09-24 DIAGNOSIS — H401113 Primary open-angle glaucoma, right eye, severe stage: Secondary | ICD-10-CM | POA: Diagnosis not present

## 2019-09-30 ENCOUNTER — Ambulatory Visit (INDEPENDENT_AMBULATORY_CARE_PROVIDER_SITE_OTHER): Payer: Medicare HMO | Admitting: Family Medicine

## 2019-09-30 ENCOUNTER — Other Ambulatory Visit: Payer: Self-pay

## 2019-09-30 ENCOUNTER — Encounter: Payer: Self-pay | Admitting: Family Medicine

## 2019-09-30 VITALS — BP 100/68 | HR 66 | Temp 97.5°F | Wt 237.0 lb

## 2019-09-30 DIAGNOSIS — N1832 Chronic kidney disease, stage 3b: Secondary | ICD-10-CM

## 2019-09-30 DIAGNOSIS — F439 Reaction to severe stress, unspecified: Secondary | ICD-10-CM | POA: Diagnosis not present

## 2019-09-30 DIAGNOSIS — G4700 Insomnia, unspecified: Secondary | ICD-10-CM

## 2019-09-30 DIAGNOSIS — J449 Chronic obstructive pulmonary disease, unspecified: Secondary | ICD-10-CM

## 2019-09-30 MED ORDER — BUDESONIDE-FORMOTEROL FUMARATE 160-4.5 MCG/ACT IN AERO: 2.0000 | INHALATION_SPRAY | Freq: Two times a day (BID) | RESPIRATORY_TRACT | 0 refills | Status: DC

## 2019-09-30 NOTE — Progress Notes (Signed)
Patient: Andrew Estes Male    DOB: [birth year]   63 y.o.   MRN: [id number] Visit Date: 09/30/2019  [name]: Lelon Huh, MD   Chief Complaint  Patient presents with  . Insomnia  . CKD   Subjective:     HPI  Follow up for CKD  The patient was last seen for this 4 months ago. Changes made at last visit include none; labs were checked showing kidney functions were a little better. Patient was advised to drink 2-3 more glasses of water and follow up in 4-5 months.  BMET BMP Latest Ref Rng & Units 05/12/2019 01/15/2019 01/10/2018  Glucose 65 - 99 mg/dL 79 79 68  BUN 8 - 27 mg/dL 14 15 19   Creatinine 0.76 - 1.27 mg/dL 1.97(H) 2.16(H) 2.04(H)  BUN/Creat Ratio 10 - 24 7(L) 7(L) 9(L)  Sodium 134 - 144 mmol/L 141 141 144  Potassium 3.5 - 5.2 mmol/L 4.2 4.6 4.7  Chloride 96 - 106 mmol/L 105 103 103  CO2 20 - 29 mmol/L 22 20 24   Calcium 8.6 - 10.2 mg/dL 9.0 9.0 8.9   He reports good compliance with treatment. He feels that condition is Unchanged. He is not having side effects.   ------------------------------------------------------------------------------------  Insomnia Follow up  He presents today for follow up of insomnia. He was last seen for this 4 months ago. Management changes included advised to increase Trazodone to 300 mg. He is not having adverse reaction from treatment.  Insomnia is getting better. He does not have difficulty FALLING asleep. He does not have difficulty STAYING asleep.  He is not taking OTC sleeping aid. Marland Kitchen He is taking medications to help sleep. He is not drinking alcohol to help sleep. He is not using illicit drugs.  He feels that his anxiety level is much better since being on sertraline, and is able to handle stressful situations much easier.  --------------------------------------------------------------------   Allergies  Allergen Reactions  . Oxycontin [Oxycodone Hcl] Nausea And Vomiting  . Tylenol With Codeine #3  [Acetaminophen-Codeine] Nausea And Vomiting  . Levofloxacin Rash and Other (See Comments)    REACTION: ORAL rash (breakout) "caused right eye vision problems and taste bud problem"     Current Outpatient Medications:  .  albuterol (PROVENTIL HFA;VENTOLIN HFA) 108 (90 Base) MCG/ACT inhaler, Inhale 2 puffs into the lungs every 6 (six) hours as needed for wheezing or shortness of breath., Disp: 1 Inhaler, Rfl: 2 .  budesonide-formoterol (SYMBICORT) 160-4.5 MCG/ACT inhaler, INHALE 2 PUFFS INTO THE LUNGS TWICE DAILY, Disp: 10.2 g, Rfl: 6 .  sertraline (ZOLOFT) 100 MG tablet, TAKE 1 TO 2 TABLETS BY MOUTH DAILY, Disp: 60 tablet, Rfl: 12 .  traZODone (DESYREL) 300 MG tablet, Take 1 tablet (300 mg total) by mouth at bedtime as needed for sleep., Disp: 90 tablet, Rfl: 1  Review of Systems  Constitutional: Negative.   Respiratory: Negative.   Cardiovascular: Negative.   Musculoskeletal: Negative.     Social History   Tobacco Use  . Smoking status: Current Every Day Smoker    Packs/day: 1.00    Years: 45.00    Pack years: 45.00    Types: Cigarettes  . Smokeless tobacco: Never Used  . Tobacco comment: started smoking at age 46  Substance Use Topics  . Alcohol use: No      Objective:   BP 100/68 (BP Location: Right Arm, Patient Position: Sitting, Cuff Size: Large)   Pulse 66   Temp (!) 97.5  F (36.4 C) (Temporal)   Wt 237 lb (107.5 kg)   SpO2 96%   BMI 32.14 kg/m  Vitals:   09/30/19 0941  BP: 100/68  Pulse: 66  Temp: (!) 97.5 F (36.4 C)  TempSrc: Temporal  SpO2: 96%  Weight: 237 lb (107.5 kg)  Body mass index is 32.14 kg/m.   Physical Exam   General Appearance:    Overweight male in no acute distress  Eyes:    PERRL, conjunctiva/corneas clear, EOM's intact       Lungs:     Clear to auscultation bilaterally, respirations unlabored  Heart:    Normal heart rate. Normal rhythm. No murmurs, rubs, or gallops.   MS:   All extremities are intact.   Neurologic:   Awake,  alert, oriented x 3. No apparent focal neurological           defect.            Assessment & Plan    1. Insomnia, unspecified type Greatly improved since increasing trazodone.   2. Situational stress Doing very well with current dose of sertraline which will be continued.   3. Stage 3b chronic kidney disease  - Renal function panel  4. Chronic obstructive pulmonary disease, unspecified COPD type ([place]) He reports he has had a lot of trouble with his breathing when wearing face mask and has had several altercations at businesses requiring him to wear a mask. He has been out of symbicort for several months and advised taking it consistently should make mask wearing much easier for him. Given samples today.  - budesonide-formoterol (SYMBICORT) 160-4.5 MCG/ACT inhaler; Inhale 2 puffs into the lungs 2 (two) times daily.  Dispense: 4 Inhaler; Refill: 0     Lelon Huh, MD  [facility]

## 2019-09-30 NOTE — Patient Instructions (Signed)
.   Please review the attached list of medications and notify my office if there are any errors.   . Please bring all of your medications to every appointment so we can make sure that our medication list is the same as yours.   . It is especially important to get the annual flu vaccine this year. If you haven't had it already, please go to your pharmacy or call the office as soon as possible to schedule you flu shot.  

## 2019-10-01 ENCOUNTER — Telehealth: Payer: Self-pay

## 2019-10-01 LAB — RENAL FUNCTION PANEL
Albumin: 4 g/dL (ref 3.8–4.8)
BUN/Creatinine Ratio: 8 — ABNORMAL LOW (ref 10–24)
BUN: 17 mg/dL (ref 8–27)
CO2: 19 mmol/L — ABNORMAL LOW (ref 20–29)
Calcium: 8.7 mg/dL (ref 8.6–10.2)
Chloride: 106 mmol/L (ref 96–106)
Creatinine, Ser: 2.15 mg/dL — ABNORMAL HIGH (ref 0.76–1.27)
GFR calc Af Amer: 37 mL/min/{1.73_m2} — ABNORMAL LOW (ref >59)
GFR calc non Af Amer: 32 mL/min/{1.73_m2} — ABNORMAL LOW (ref >59)
Glucose: 75 mg/dL (ref 65–99)
Phosphorus: 3.2 mg/dL (ref 2.8–4.1)
Potassium: 4.2 mmol/L (ref 3.5–5.2)
Sodium: 140 mmol/L (ref 134–144)

## 2019-10-01 NOTE — Telephone Encounter (Signed)
Patient advised and appointment made

## 2019-10-01 NOTE — Telephone Encounter (Signed)
-----   Message from [name], MD sent at 10/01/2019  7:54 AM EDT ----- Kidney functions have gotten worse again. Need to drink more water. Schedule follow up 3-4 months. If not improving at follow up will need to see nephrologist.

## 2019-10-01 NOTE — Telephone Encounter (Signed)
LMTCB

## 2019-10-01 NOTE — Telephone Encounter (Signed)
Pt returned missed call.  Please call pt back at [phone].  Thanks, [employer]

## 2019-10-11 ENCOUNTER — Other Ambulatory Visit: Payer: Self-pay | Admitting: Family Medicine

## 2019-10-11 DIAGNOSIS — G4700 Insomnia, unspecified: Secondary | ICD-10-CM

## 2019-10-13 ENCOUNTER — Other Ambulatory Visit: Payer: Self-pay | Admitting: Family Medicine

## 2019-10-13 ENCOUNTER — Telehealth: Payer: Self-pay | Admitting: Family Medicine

## 2019-10-13 ENCOUNTER — Other Ambulatory Visit: Payer: Self-pay

## 2019-10-13 DIAGNOSIS — G4700 Insomnia, unspecified: Secondary | ICD-10-CM

## 2019-10-13 MED ORDER — TRAZODONE HCL 300 MG PO TABS: 300.0000 mg | ORAL_TABLET | Freq: Every evening | ORAL | 1 refills | Status: DC | PRN

## 2019-10-13 NOTE — Telephone Encounter (Signed)
Pt returned missed call.  Please call pt back at [phone].  Thanks, [employer]

## 2019-10-13 NOTE — Telephone Encounter (Signed)
Patient called requesting an early refill for Trazodone. He states his puppy got into his medication, chewed the top off and slobbered all over his mediation. Patient is completely out and needs a refill rent in today. Please advise.

## 2019-12-18 DIAGNOSIS — H401113 Primary open-angle glaucoma, right eye, severe stage: Secondary | ICD-10-CM | POA: Diagnosis not present

## 2020-01-13 ENCOUNTER — Ambulatory Visit (INDEPENDENT_AMBULATORY_CARE_PROVIDER_SITE_OTHER): Payer: Medicare HMO | Admitting: Family Medicine

## 2020-01-13 ENCOUNTER — Other Ambulatory Visit: Payer: Self-pay

## 2020-01-13 ENCOUNTER — Encounter: Payer: Self-pay | Admitting: Family Medicine

## 2020-01-13 VITALS — BP 99/67 | HR 88 | Temp 97.1°F | Wt 207.4 lb

## 2020-01-13 DIAGNOSIS — F439 Reaction to severe stress, unspecified: Secondary | ICD-10-CM

## 2020-01-13 DIAGNOSIS — G4700 Insomnia, unspecified: Secondary | ICD-10-CM | POA: Diagnosis not present

## 2020-01-13 DIAGNOSIS — Z125 Encounter for screening for malignant neoplasm of prostate: Secondary | ICD-10-CM | POA: Diagnosis not present

## 2020-01-13 DIAGNOSIS — N1832 Chronic kidney disease, stage 3b: Secondary | ICD-10-CM | POA: Diagnosis not present

## 2020-01-13 MED ORDER — TRAZODONE HCL 300 MG PO TABS: 300.0000 mg | ORAL_TABLET | Freq: Every evening | ORAL | 2 refills | Status: DC | PRN

## 2020-01-13 NOTE — Progress Notes (Signed)
Patient: Andrew Estes Male    DOB: December 11, 1955   64 y.o.   MRN: [id number] Visit Date: 01/13/2020  [name]: Lelon Huh, MD   Chief Complaint  Patient presents with  . CKD   Subjective:     HPI  Follow up forCKD  The patient was last seen for this3 monthsago. Changes made at last visit includenone labs were checked showing kidney functions were worse. Patient was advised to drink  water and follow up in 3 months. If not improving will need Nephrologist referral.  Hereports goodcompliance with treatment. Hefeels that condition is Unchanged. Heis nothaving side effects.   BMP Latest Ref Rng & Units 09/30/2019 05/12/2019 01/15/2019  Glucose 65 - 99 mg/dL 75 79 79  BUN 8 - 27 mg/dL 17 14 15   Creatinine 0.76 - 1.27 mg/dL 2.15(H) 1.97(H) 2.16(H)  BUN/Creat Ratio 10 - 24 8(L) 7(L) 7(L)  Sodium 134 - 144 mmol/L 140 141 141  Potassium 3.5 - 5.2 mmol/L 4.2 4.2 4.6  Chloride 96 - 106 mmol/L 106 105 103  CO2 20 - 29 mmol/L 19(L) 22 20  Calcium 8.6 - 10.2 mg/dL 8.7 9.0 9.0   He states he has been uch active lagtely and has cut out see and soda.  Allergies  Allergen Reactions  . Oxycontin [Oxycodone Hcl] Nausea And Vomiting  . Tylenol With Codeine #3 [Acetaminophen-Codeine] Nausea And Vomiting  . Levofloxacin Rash and Other (See Comments)    REACTION: ORAL rash (breakout) "caused right eye vision problems and taste bud problem"     Current Outpatient Medications:  .  albuterol (PROVENTIL HFA;VENTOLIN HFA) 108 (90 Base) MCG/ACT inhaler, Inhale 2 puffs into the lungs every 6 (six) hours as needed for wheezing or shortness of breath., Disp: 1 Inhaler, Rfl: 2 .  budesonide-formoterol (SYMBICORT) 160-4.5 MCG/ACT inhaler, Inhale 2 puffs into the lungs 2 (two) times daily., Disp: 4 Inhaler, Rfl: 0 .  sertraline (ZOLOFT) 100 MG tablet, TAKE 1 TO 2 TABLETS BY MOUTH DAILY, Disp: 60 tablet, Rfl: 12 .  trazodone (DESYREL) 300 MG tablet, Take 1 tablet (300 mg total)  by mouth at bedtime as needed for sleep., Disp: 90 tablet, Rfl: 1  Review of Systems  Constitutional: Negative.   Respiratory: Negative.   Cardiovascular: Negative.   Musculoskeletal: Negative.     Social History   Tobacco Use  . Smoking status: Current Every Day Smoker    Packs/day: 1.00    Years: 45.00    Pack years: 45.00    Types: Cigarettes  . Smokeless tobacco: Never Used  . Tobacco comment: started smoking at age 23  Substance Use Topics  . Alcohol use: No      Objective:   BP 99/67 (BP Location: Right Arm, Patient Position: Sitting, Cuff Size: Large)   Pulse 88   Temp (!) 97.1 F (36.2 C) (Temporal)   Wt 207 lb 6.4 oz (94.1 kg)   BMI 28.13 kg/m  Vitals:   01/13/20 1331  BP: 99/67  Pulse: 88  Temp: (!) 97.1 F (36.2 C)  TempSrc: Temporal  Weight: 207 lb 6.4 oz (94.1 kg)  Body mass index is 28.13 kg/m.   Physical Exam   General: Appearance:     Overweight male in no acute distress  Eyes:    PERRL, conjunctiva/corneas clear, EOM's intact       Lungs:     Clear to auscultation bilaterally, respirations unlabored  Heart:    Normal heart rate. Normal rhythm.  No murmurs, rubs, or gallops.   MS:   All extremities are intact.   Neurologic:   Awake, alert, oriented x 3. No apparent focal neurological           defect.            Assessment & Plan    1. Stage 3b chronic kidney disease Due to check labs: - Comprehensive metabolic panel - TSH - CBC  2. Situational stress Doing much better on current dose of sertraline, Continue current medications.    3. Insomnia, unspecified type Occasionally taking extra 1/2 tablet trazodone overnight, refill- trazodone (DESYREL) 300 MG tablet; Take 1-1.5 tablets (300-450 mg total) by mouth at bedtime as needed for sleep.  Dispense: 100 tablet; Refill: 2  4. Prostate cancer screening  - PSA     Lelon Huh, MD  [facility]

## 2020-01-14 ENCOUNTER — Other Ambulatory Visit: Payer: Self-pay | Admitting: Family Medicine

## 2020-01-14 ENCOUNTER — Telehealth: Payer: Self-pay

## 2020-01-14 DIAGNOSIS — R972 Elevated prostate specific antigen [PSA]: Secondary | ICD-10-CM

## 2020-01-14 LAB — COMPREHENSIVE METABOLIC PANEL
ALT: 7 IU/L (ref 0–44)
AST: 11 IU/L (ref 0–40)
Albumin/Globulin Ratio: 1.6 (ref 1.2–2.2)
Albumin: 4.4 g/dL (ref 3.8–4.8)
Alkaline Phosphatase: 99 IU/L (ref 39–117)
BUN/Creatinine Ratio: 12 (ref 10–24)
BUN: 24 mg/dL (ref 8–27)
Bilirubin Total: 0.4 mg/dL (ref 0.0–1.2)
CO2: 14 mmol/L — ABNORMAL LOW (ref 20–29)
Calcium: 9.5 mg/dL (ref 8.6–10.2)
Chloride: 105 mmol/L (ref 96–106)
Creatinine, Ser: 2.07 mg/dL — ABNORMAL HIGH (ref 0.76–1.27)
GFR calc Af Amer: 38 mL/min/{1.73_m2} — ABNORMAL LOW (ref >59)
GFR calc non Af Amer: 33 mL/min/{1.73_m2} — ABNORMAL LOW (ref >59)
Globulin, Total: 2.8 g/dL (ref 1.5–4.5)
Glucose: 69 mg/dL (ref 65–99)
Potassium: 4.8 mmol/L (ref 3.5–5.2)
Sodium: 137 mmol/L (ref 134–144)
Total Protein: 7.2 g/dL (ref 6.0–8.5)

## 2020-01-14 LAB — CBC
Hematocrit: 41.5 % (ref 37.5–51.0)
Hemoglobin: 13.7 g/dL (ref 13.0–17.7)
MCH: 31.9 pg (ref 26.6–33.0)
MCHC: 33 g/dL (ref 31.5–35.7)
MCV: 97 fL (ref 79–97)
Platelets: 173 10*3/uL (ref 150–450)
RBC: 4.29 x10E6/uL (ref 4.14–5.80)
RDW: 13.2 % (ref 11.6–15.4)
WBC: 7.8 10*3/uL (ref 3.4–10.8)

## 2020-01-14 LAB — PSA: Prostate Specific Ag, Serum: 5 ng/mL — ABNORMAL HIGH (ref 0.0–4.0)

## 2020-01-14 LAB — TSH: TSH: 1.58 u[IU]/mL (ref 0.450–4.500)

## 2020-01-14 MED ORDER — DOXYCYCLINE HYCLATE 100 MG PO TABS: 100.0000 mg | ORAL_TABLET | Freq: Two times a day (BID) | ORAL | 0 refills | Status: AC

## 2020-01-14 NOTE — Telephone Encounter (Signed)
-----   Message from [name], MD sent at 01/14/2020  8:00 AM EST ----- PSA is high. This could be an indication of prostate infection or disease. Need to take doxycycline 100mg  twice daily for 14 days, and recheck psa in 2-3 weeks. Have sent in prescription.  Have him go to lab first week of March. Will put in future order.

## 2020-01-14 NOTE — Telephone Encounter (Signed)
LMTCB, Triage may give results

## 2020-01-14 NOTE — Telephone Encounter (Signed)
Pt. Called back, given results and instructions.Verbalizes understanding.

## 2020-01-26 DIAGNOSIS — H401113 Primary open-angle glaucoma, right eye, severe stage: Secondary | ICD-10-CM | POA: Diagnosis not present

## 2020-02-19 DIAGNOSIS — H401113 Primary open-angle glaucoma, right eye, severe stage: Secondary | ICD-10-CM | POA: Diagnosis not present

## 2020-03-05 ENCOUNTER — Telehealth: Payer: Self-pay | Admitting: Family Medicine

## 2020-03-05 NOTE — Telephone Encounter (Signed)
LMOVM advising patient he is due for lab follow up. Okay for PEC to give pt message below.

## 2020-03-05 NOTE — Telephone Encounter (Signed)
Please advise patient it is time to recheck PSA since it was high in February. Future order is in Epic. He can stop by anytime, does not need to be fasting.

## 2020-03-15 NOTE — Telephone Encounter (Signed)
LMTCB, okay for PEC to advise patient.  

## 2020-03-17 NOTE — Telephone Encounter (Signed)
Tried calling patient. Unable to leave message due to mailbox full. Letter mailed to patients home address.

## 2020-04-15 NOTE — Progress Notes (Signed)
 Subjective:   Andrew Estes is a 64 y.o. male who presents for Medicare Annual/Subsequent preventive examination.  I connected with Bentley Conaway today by telephone and verified that I am speaking with the correct person using two identifiers. Location patient: home Location provider: work Persons participating in the virtual visit: patient, provider.   I discussed the limitations, risks, security and privacy concerns of performing an evaluation and management service by telephone and the availability of in person appointments. I also discussed with the patient that there may be a patient responsible charge related to this service. The patient expressed understanding and verbally consented to this telephonic visit.    Interactive audio and video telecommunications were attempted between this provider and patient, however failed, due to patient having technical difficulties OR patient did not have access to video capability.  We continued and completed visit with audio only.   Review of Systems:  N/A  Cardiac Risk Factors include: advanced age (>55men, >65 women);obesity (BMI >30kg/m2);smoking/ tobacco exposure     Objective:    Vitals: There were no vitals taken for this visit.  There is no height or weight on file to calculate BMI.  Advanced Directives 04/19/2020 02/21/2018 04/15/2015 12/09/2014 11/24/2014 11/19/2014 12/31/2013  Does Patient Have a Medical Advance Directive? No No No No No No Patient has advance directive, copy not in chart  Type of Advance Directive - - - - - - Living will  Does patient want to make changes to medical advance directive? - - - - - - No  Copy of Healthcare Power of Attorney in Chart? - - - - - - Copy requested from family  Would patient like information on creating a medical advance directive? No - Patient declined No - Patient declined Yes - Educational materials given - No - patient declined information No - patient declined information -  Pre-existing  out of facility DNR order (yellow form or pink MOST form) - - - - - - No    Tobacco Social History   Tobacco Use  Smoking Status Current Every Day Smoker  . Packs/day: 1.00  . Years: 45.00  . Pack years: 45.00  . Types: Cigarettes  Smokeless Tobacco Never Used  Tobacco Comment   started smoking at age 10     Ready to quit: Yes Counseling given: No Comment: started smoking at age 10   Clinical Intake:  Pre-visit preparation completed: Yes  Pain : No/denies pain Pain Score: 0-No pain     Nutritional Status: BMI > 30  Obese Nutritional Risks: None Diabetes: No  How often do you need to have someone help you when you read instructions, pamphlets, or other written materials from your doctor or pharmacy?: 1 - Never  Interpreter Needed?: No  Information entered by :: Mmarkoski, LPN  Past Medical History:  Diagnosis Date  . Cataract   . COPD (chronic obstructive pulmonary disease) (H[facility])   . Glaucoma   . Hyperlipidemia   . Obesity   . Renal failure, unspecified 06/15/2012   Past Surgical History:  Procedure Laterality Date  . BACK SURGERY     has had a total of 5 back surgeries   Family History  Problem Relation Age of Onset  . Diverticulitis Mother   . Cancer Mother 82       pancreas  . Osteoarthritis Father    Social History   Socioeconomic History  . Marital status: Single    Spouse name: Not on file  . Number of   children: 1  . Years of education: Not on file  . Highest education level: High school graduate  Occupational History  . Occupation: tractractor-trailer driver    Employer: [employer]    Comment: on workers comp x 2.5 yrs  . Occupation: disability  Tobacco Use  . Smoking status: Current Every Day Smoker    Packs/day: 1.00    Years: 45.00    Pack years: 45.00    Types: Cigarettes  . Smokeless tobacco: Never Used  . Tobacco comment: started smoking at age 10  Substance and Sexual Activity  . Alcohol use: No  . Drug use: No  .  Sexual activity: Yes  Other Topics Concern  . Not on file  Social History Narrative  . Not on file   Social Determinants of Health   Financial Resource Strain: Low Risk   . Difficulty of Paying Living Expenses: Not hard at all  Food Insecurity: No Food Insecurity  . Worried About Running Out of Food in the Last Year: Never true  . Ran Out of Food in the Last Year: Never true  Transportation Needs: No Transportation Needs  . Lack of Transportation (Medical): No  . Lack of Transportation (Non-Medical): No  Physical Activity: Inactive  . Days of Exercise per Week: 0 days  . Minutes of Exercise per Session: 0 min  Stress: Stress Concern Present  . Feeling of Stress : To some extent  Social Connections: Moderately Isolated  . Frequency of Communication with Friends and Family: Twice a week  . Frequency of Social Gatherings with Friends and Family: More than three times a week  . Attends Religious Services: Never  . Active Member of Clubs or Organizations: No  . Attends Club or Organization Meetings: Never  . Marital Status: Divorced    Outpatient Encounter Medications as of 04/19/2020  Medication Sig  . albuterol (PROVENTIL HFA;VENTOLIN HFA) 108 (90 Base) MCG/ACT inhaler Inhale 2 puffs into the lungs every 6 (six) hours as needed for wheezing or shortness of breath.  . brimonidine (ALPHAGAN) 0.2 % ophthalmic solution 1 drop 2 (two) times daily.  . budesonide-formoterol (SYMBICORT) 160-4.5 MCG/ACT inhaler Inhale 2 puffs into the lungs 2 (two) times daily.  . latanoprost (XALATAN) 0.005 % ophthalmic solution 1 drop at bedtime.  . sertraline (ZOLOFT) 100 MG tablet TAKE 1 TO 2 TABLETS BY MOUTH DAILY  . timolol (TIMOPTIC) 0.5 % ophthalmic solution 1 drop 2 (two) times daily.  . trazodone (DESYREL) 300 MG tablet Take 1-1.5 tablets (300-450 mg total) by mouth at bedtime as needed for sleep.   No facility-administered encounter medications on file as of 04/19/2020.    Activities of Daily  Living In your present state of health, do you have any difficulty performing the following activities: 04/19/2020 09/30/2019  Hearing? N N  Vision? Y N  Comment Due to glaucoma and cataracts in both eyes. -  Difficulty concentrating or making decisions? N N  Walking or climbing stairs? Y N  Comment Due to knee pains. -  Dressing or bathing? N N  Doing errands, shopping? N N  Preparing Food and eating ? N -  Using the Toilet? N -  In the past six months, have you accidently leaked urine? N -  Do you have problems with loss of bowel control? N -  Managing your Medications? N -  Managing your Finances? N -  Housekeeping or managing your Housekeeping? N -  Some recent data might be hidden    Patient   Care Team: Fisher, [name], MD as PCP - General (Family Medicine) King, Bradley Mark, MD as Consulting Physician (Ophthalmology)   Assessment:   This is a routine wellness examination for Zayyan.  Exercise Activities and Dietary recommendations Current Exercise Habits: The patient does not participate in regular exercise at present, Exercise limited by: None identified  Goals    . Quit Smoking     Recommend to consult PCP for alternative ways to quit smoking to aid in bettering health.        Fall Risk: Fall Risk  04/19/2020 09/30/2019 02/21/2018  Falls in the past year? 0 0 No  Number falls in past yr: 0 0 -  Injury with Fall? 0 0 -    FALL RISK PREVENTION PERTAINING TO THE HOME:  Any stairs in or around the home? Yes  If so, are there any without handrails? No   Home free of loose throw rugs in walkways, pet beds, electrical cords, etc? Yes  Adequate lighting in your home to reduce risk of falls? Yes   ASSISTIVE DEVICES UTILIZED TO PREVENT FALLS:  Life alert? No  Use of a cane, walker or w/c? No  Grab bars in the bathroom? No  Shower chair or bench in shower? Yes  Elevated toilet seat or a handicapped toilet? No   TIMED UP AND GO:  Was the test performed? No .     Depression Screen PHQ 2/9 Scores 04/19/2020 09/30/2019 02/21/2018 01/10/2018  PHQ - 2 Score 0 0 6 3  PHQ- 9 Score - 0 9 6    Cognitive Function         There is no immunization history on file for this patient.  Qualifies for Shingles Vaccine? Yes . Due for Shingrix. Pt has been advised to call insurance company to determine out of pocket expense. Advised may also receive vaccine at local pharmacy or Health Dept. Verbalized acceptance and understanding.  Tdap: Although this vaccine is not a covered service during a Wellness Exam, does the patient still wish to receive this vaccine today?  No . Advised may receive this vaccine at local pharmacy or Health Dept. Aware to provide a copy of the vaccination record if obtained from local pharmacy or Health Dept. Verbalized acceptance and understanding.  Flu Vaccine: Due fall 2021.  Screening Tests Health Maintenance  Topic Date Due  . Hepatitis C Screening  Never done  . URINE MICROALBUMIN  Never done  . HIV Screening  Never done  . COVID-19 Vaccine (1) Never done  . COLONOSCOPY  Never done  . TETANUS/TDAP  04/19/2021 (Originally 05/17/1975)  . INFLUENZA VACCINE  07/04/2020   Cancer Screenings:  Colorectal Screening: Currently due. Declined scheduling a colonoscopy referral or ordering a cologuard kit at this time.   Lung Cancer Screening: (Low Dose CT Chest recommended if Age 55-80 years, 30 pack-year currently smoking OR have quit w/in 15years.) does qualify. An Epic message has been sent to Shawn Perkins, RN (Oncology Nurse Navigator) regarding the possible need for this exam. Shawn will review the patient's chart to determine if the patient truly qualifies for the exam. If the patient qualifies, Shawn will order the Low Dose CT of the chest to facilitate the scheduling of this exam.  Additional Screening:  Hepatitis C Screening: does qualify and would like this added to blood work orders at next in office apt.   Vision  Screening: Recommended annual ophthalmology exams for early detection of glaucoma and other disorders of the eye.    Dental Screening: Recommended annual dental exams for proper oral hygiene  Community Resource Referral:  CRR required this visit?  No        Plan:  I have personally reviewed and addressed the Medicare Annual Wellness questionnaire and have noted the following in the patient's chart:  A. Medical and social history B. Use of alcohol, tobacco or illicit drugs  C. Current medications and supplements D. Functional ability and status E.  Nutritional status F.  Physical activity G. Advance directives H. List of other physicians I.  Hospitalizations, surgeries, and ER visits in previous 12 months J.  [place] such as hearing and vision if needed, cognitive and depression L. Referrals and appointments   In addition, I have reviewed and discussed with patient certain preventive protocols, quality metrics, and best practice recommendations. A written personalized care plan for preventive services as well as general preventive health recommendations were provided to patient.   Glendora Score, [place]  [id number] Nurse Health Advisor   Nurse Notes: Pt would like the Hep C lab and HIV labs to be added to next blood work orders. Pt needs a urine check at next in office apt. Pt declined a colonoscopy referral or cologuard order today.

## 2020-04-16 DIAGNOSIS — H401133 Primary open-angle glaucoma, bilateral, severe stage: Secondary | ICD-10-CM | POA: Diagnosis not present

## 2020-04-19 ENCOUNTER — Other Ambulatory Visit: Payer: Self-pay

## 2020-04-19 ENCOUNTER — Ambulatory Visit (INDEPENDENT_AMBULATORY_CARE_PROVIDER_SITE_OTHER): Payer: Medicare HMO

## 2020-04-19 DIAGNOSIS — Z0000 Encounter for general adult medical examination without abnormal findings: Secondary | ICD-10-CM | POA: Diagnosis not present

## 2020-04-19 NOTE — Patient Instructions (Signed)
Andrew Estes , Thank you for taking time to come for your Medicare Wellness Visit. I appreciate your ongoing commitment to your health goals. Please review the following plan we discussed and let me know if I can assist you in the future.   Screening recommendations/referrals: Colonoscopy: Currently due- declined referral or cologuard kit order at this time. Recommended yearly ophthalmology/optometry visit for glaucoma screening and checkup Recommended yearly dental visit for hygiene and checkup  Vaccinations: Influenza vaccine: Due fall 2021 Tdap vaccine: Pt declines today.  Shingles vaccine: Pt declines today.     Advanced directives: Advance directive discussed with you today. Even though you declined this today please call our office should you change your mind and we can give you the proper paperwork for you to fill out.  Conditions/risks identified: Smoking cessation discussed today.   Next appointment: 04/26/21 @ 2:40 PM for an AWV. Declined scheduling a follow up with PCP at this time.   Preventive Care 40-64 Years, Male Preventive care refers to lifestyle choices and visits with your health care provider that can promote health and wellness. What does preventive care include?  A yearly physical exam. This is also called an annual well check.  Dental exams once or twice a year.  Routine eye exams. Ask your health care provider how often you should have your eyes checked.  Personal lifestyle choices, including:  Daily care of your teeth and gums.  Regular physical activity.  Eating a healthy diet.  Avoiding tobacco and drug use.  Limiting alcohol use.  Practicing safe sex.  Taking low-dose aspirin every day starting at age 22. What happens during an annual well check? The services and screenings done by your health care provider during your annual well check will depend on your age, overall health, lifestyle risk factors, and family history of disease. Counseling   Your health care provider may ask you questions about your:  Alcohol use.  Tobacco use.  Drug use.  Emotional well-being.  Home and relationship well-being.  Sexual activity.  Eating habits.  Work and work Statistician. Screening  You may have the following tests or measurements:  Height, weight, and BMI.  Blood pressure.  Lipid and cholesterol levels. These may be checked every 5 years, or more frequently if you are over 36 years old.  Skin check.  Lung cancer screening. You may have this screening every year starting at age 24 if you have a 30-pack-year history of smoking and currently smoke or have quit within the past 15 years.  Fecal occult blood test (FOBT) of the stool. You may have this test every year starting at age 26.  Flexible sigmoidoscopy or colonoscopy. You may have a sigmoidoscopy every 5 years or a colonoscopy every 10 years starting at age 21.  Prostate cancer screening. Recommendations will vary depending on your family history and other risks.  Hepatitis C blood test.  Hepatitis B blood test.  Sexually transmitted disease (STD) testing.  Diabetes screening. This is done by checking your blood sugar (glucose) after you have not eaten for a while (fasting). You may have this done every 1-3 years. Discuss your test results, treatment options, and if necessary, the need for more tests with your health care provider. Vaccines  Your health care provider may recommend certain vaccines, such as:  Influenza vaccine. This is recommended every year.  Tetanus, diphtheria, and acellular pertussis (Tdap, Td) vaccine. You may need a Td booster every 10 years.  Zoster vaccine. You may need this after  age 56.  Pneumococcal 13-valent conjugate (PCV13) vaccine. You may need this if you have certain conditions and have not been vaccinated.  Pneumococcal polysaccharide (PPSV23) vaccine. You may need one or two doses if you smoke cigarettes or if you have  certain conditions. Talk to your health care provider about which screenings and vaccines you need and how often you need them. This information is not intended to replace advice given to you by your health care provider. Make sure you discuss any questions you have with your health care provider. Document Released: 12/17/2015 Document Revised: 08/09/2016 Document Reviewed: 09/21/2015 Elsevier Interactive Patient Education  2017 [place] Prevention in the Home Falls can cause injuries. They can happen to people of all ages. There are many things you can do to make your home safe and to help prevent falls. What can I do on the outside of my home?  Regularly fix the edges of walkways and driveways and fix any cracks.  Remove anything that might make you trip as you walk through a door, such as a raised step or threshold.  Trim any bushes or trees on the path to your home.  Use bright outdoor lighting.  Clear any walking paths of anything that might make someone trip, such as rocks or tools.  Regularly check to see if handrails are loose or broken. Make sure that both sides of any steps have handrails.  Any raised decks and porches should have guardrails on the edges.  Have any leaves, snow, or ice cleared regularly.  Use sand or salt on walking paths during winter.  Clean up any spills in your garage right away. This includes oil or grease spills. What can I do in the bathroom?  Use night lights.  Install grab bars by the toilet and in the tub and shower. Do not use towel bars as grab bars.  Use non-skid mats or decals in the tub or shower.  If you need to sit down in the shower, use a plastic, non-slip stool.  Keep the floor dry. Clean up any water that spills on the floor as soon as it happens.  Remove soap buildup in the tub or shower regularly.  Attach bath mats securely with double-sided non-slip rug tape.  Do not have throw rugs and other things on the  floor that can make you trip. What can I do in the bedroom?  Use night lights.  Make sure that you have a light by your bed that is easy to reach.  Do not use any sheets or blankets that are too big for your bed. They should not hang down onto the floor.  Have a firm chair that has side arms. You can use this for support while you get dressed.  Do not have throw rugs and other things on the floor that can make you trip. What can I do in the kitchen?  Clean up any spills right away.  Avoid walking on wet floors.  Keep items that you use a lot in easy-to-reach places.  If you need to reach something above you, use a strong step stool that has a grab bar.  Keep electrical cords out of the way.  Do not use floor polish or wax that makes floors slippery. If you must use wax, use non-skid floor wax.  Do not have throw rugs and other things on the floor that can make you trip. What can I do with my stairs?  Do not leave any  items on the stairs.  Make sure that there are handrails on both sides of the stairs and use them. Fix handrails that are broken or loose. Make sure that handrails are as long as the stairways.  Check any carpeting to make sure that it is firmly attached to the stairs. Fix any carpet that is loose or worn.  Avoid having throw rugs at the top or bottom of the stairs. If you do have throw rugs, attach them to the floor with carpet tape.  Make sure that you have a light switch at the top of the stairs and the bottom of the stairs. If you do not have them, ask someone to add them for you. What else can I do to help prevent falls?  Wear shoes that:  Do not have high heels.  Have rubber bottoms.  Are comfortable and fit you well.  Are closed at the toe. Do not wear sandals.  If you use a stepladder:  Make sure that it is fully opened. Do not climb a closed stepladder.  Make sure that both sides of the stepladder are locked into place.  Ask someone to  hold it for you, if possible.  Clearly mark and make sure that you can see:  Any grab bars or handrails.  First and last steps.  Where the edge of each step is.  Use tools that help you move around (mobility aids) if they are needed. These include:  Canes.  Walkers.  Scooters.  Crutches.  Turn on the lights when you go into a dark area. Replace any light bulbs as soon as they burn out.  Set up your furniture so you have a clear path. Avoid moving your furniture around.  If any of your floors are uneven, fix them.  If there are any pets around you, be aware of where they are.  Review your medicines with your doctor. Some medicines can make you feel dizzy. This can increase your chance of falling. Ask your doctor what other things that you can do to help prevent falls. This information is not intended to replace advice given to you by your health care provider. Make sure you discuss any questions you have with your health care provider. Document Released: 09/16/2009 Document Revised: 04/27/2016 Document Reviewed: 12/25/2014 Elsevier Interactive Patient Education  2017 [employer].

## 2020-04-30 ENCOUNTER — Other Ambulatory Visit: Payer: Self-pay | Admitting: Family Medicine

## 2020-04-30 DIAGNOSIS — J449 Chronic obstructive pulmonary disease, unspecified: Secondary | ICD-10-CM

## 2020-04-30 MED ORDER — BUDESONIDE-FORMOTEROL FUMARATE 160-4.5 MCG/ACT IN AERO: 2.0000 | INHALATION_SPRAY | Freq: Two times a day (BID) | RESPIRATORY_TRACT | 0 refills | Status: DC

## 2020-04-30 NOTE — Telephone Encounter (Signed)
Medication: budesonide-formoterol (SYMBICORT) 160-4.5 MCG/ACT inhaler [[id number]] - Last appt was 01/13/20 with Dr. Caryn Section  Has the patient contacted their pharmacy? YES (Agent: If no, request that the patient contact the pharmacy for the refill.) (Agent: If yes, when and what did the pharmacy advise?)  Preferred Pharmacy (with phone number or street name): [id number] [name], [place]  Phone:  [phone] Fax:  [phone]     Agent: Please be advised that RX refills may take up to 3 business days. We ask that you follow-up with your pharmacy.

## 2020-05-18 ENCOUNTER — Other Ambulatory Visit: Payer: Self-pay

## 2020-05-18 ENCOUNTER — Ambulatory Visit: Payer: Medicare HMO | Attending: Internal Medicine

## 2020-05-18 DIAGNOSIS — Z20822 Contact with and (suspected) exposure to covid-19: Secondary | ICD-10-CM

## 2020-05-19 LAB — SARS-COV-2, NAA 2 DAY TAT

## 2020-05-19 LAB — NOVEL CORONAVIRUS, NAA: SARS-CoV-2, NAA: NOT DETECTED

## 2020-05-28 DIAGNOSIS — N1832 Chronic kidney disease, stage 3b: Secondary | ICD-10-CM | POA: Diagnosis not present

## 2020-05-28 DIAGNOSIS — J449 Chronic obstructive pulmonary disease, unspecified: Secondary | ICD-10-CM | POA: Diagnosis not present

## 2020-05-28 DIAGNOSIS — Z720 Tobacco use: Secondary | ICD-10-CM | POA: Diagnosis not present

## 2020-05-28 DIAGNOSIS — G4730 Sleep apnea, unspecified: Secondary | ICD-10-CM | POA: Diagnosis not present

## 2020-05-28 DIAGNOSIS — F329 Major depressive disorder, single episode, unspecified: Secondary | ICD-10-CM | POA: Diagnosis not present

## 2020-06-03 HISTORY — PX: GLAUCOMA SURGERY: SHX656

## 2020-06-11 DIAGNOSIS — Z20822 Contact with and (suspected) exposure to covid-19: Secondary | ICD-10-CM | POA: Diagnosis not present

## 2020-06-11 DIAGNOSIS — Z01812 Encounter for preprocedural laboratory examination: Secondary | ICD-10-CM | POA: Diagnosis not present

## 2020-06-15 ENCOUNTER — Encounter: Payer: Self-pay | Admitting: Family Medicine

## 2020-06-15 DIAGNOSIS — J449 Chronic obstructive pulmonary disease, unspecified: Secondary | ICD-10-CM | POA: Diagnosis not present

## 2020-06-15 DIAGNOSIS — G4730 Sleep apnea, unspecified: Secondary | ICD-10-CM | POA: Diagnosis not present

## 2020-06-15 DIAGNOSIS — H2511 Age-related nuclear cataract, right eye: Secondary | ICD-10-CM | POA: Diagnosis not present

## 2020-06-15 DIAGNOSIS — H401113 Primary open-angle glaucoma, right eye, severe stage: Secondary | ICD-10-CM | POA: Diagnosis not present

## 2020-06-15 DIAGNOSIS — H4089 Other specified glaucoma: Secondary | ICD-10-CM | POA: Diagnosis not present

## 2020-06-15 DIAGNOSIS — F17210 Nicotine dependence, cigarettes, uncomplicated: Secondary | ICD-10-CM | POA: Diagnosis not present

## 2020-06-15 DIAGNOSIS — Z79899 Other long term (current) drug therapy: Secondary | ICD-10-CM | POA: Diagnosis not present

## 2020-06-15 DIAGNOSIS — N1832 Chronic kidney disease, stage 3b: Secondary | ICD-10-CM | POA: Diagnosis not present

## 2020-06-15 DIAGNOSIS — F329 Major depressive disorder, single episode, unspecified: Secondary | ICD-10-CM | POA: Diagnosis not present

## 2020-09-27 ENCOUNTER — Other Ambulatory Visit: Payer: Self-pay | Admitting: Family Medicine

## 2020-09-27 DIAGNOSIS — F439 Reaction to severe stress, unspecified: Secondary | ICD-10-CM

## 2020-09-27 DIAGNOSIS — G4700 Insomnia, unspecified: Secondary | ICD-10-CM

## 2020-09-27 NOTE — Telephone Encounter (Signed)
Patient is calling to check the status of his medication refill requests.  He stated that he is going out of town and needs to get them filled before he leaves.  Please advise and call patient to give him an update at [phone]

## 2020-09-27 NOTE — Telephone Encounter (Signed)
Patient advised that he is due for follow up appointment. Appointment scheduled for tomorrow 09/28/2020 at 2:40pm. Patient needs a refill now. He states he is completely out. Patient is requesting a courtesy refill. Please advise.

## 2020-09-28 ENCOUNTER — Other Ambulatory Visit: Payer: Self-pay

## 2020-09-28 ENCOUNTER — Encounter: Payer: Self-pay | Admitting: Family Medicine

## 2020-09-28 ENCOUNTER — Ambulatory Visit (INDEPENDENT_AMBULATORY_CARE_PROVIDER_SITE_OTHER): Payer: Medicare HMO | Admitting: Family Medicine

## 2020-09-28 VITALS — BP 116/75 | HR 78 | Temp 98.6°F | Resp 20 | Ht 71.0 in | Wt 206.0 lb

## 2020-09-28 DIAGNOSIS — F439 Reaction to severe stress, unspecified: Secondary | ICD-10-CM | POA: Diagnosis not present

## 2020-09-28 DIAGNOSIS — J449 Chronic obstructive pulmonary disease, unspecified: Secondary | ICD-10-CM | POA: Diagnosis not present

## 2020-09-28 DIAGNOSIS — R739 Hyperglycemia, unspecified: Secondary | ICD-10-CM

## 2020-09-28 DIAGNOSIS — N1832 Chronic kidney disease, stage 3b: Secondary | ICD-10-CM | POA: Diagnosis not present

## 2020-09-28 DIAGNOSIS — Z720 Tobacco use: Secondary | ICD-10-CM

## 2020-09-28 DIAGNOSIS — R972 Elevated prostate specific antigen [PSA]: Secondary | ICD-10-CM

## 2020-09-28 DIAGNOSIS — E559 Vitamin D deficiency, unspecified: Secondary | ICD-10-CM

## 2020-09-28 DIAGNOSIS — N529 Male erectile dysfunction, unspecified: Secondary | ICD-10-CM

## 2020-09-28 DIAGNOSIS — G4700 Insomnia, unspecified: Secondary | ICD-10-CM

## 2020-09-28 MED ORDER — SILDENAFIL CITRATE 100 MG PO TABS: 50.0000 mg | ORAL_TABLET | Freq: Every day | ORAL | 4 refills | Status: DC | PRN

## 2020-09-28 MED ORDER — BUDESONIDE-FORMOTEROL FUMARATE 160-4.5 MCG/ACT IN AERO: 2.0000 | INHALATION_SPRAY | Freq: Two times a day (BID) | RESPIRATORY_TRACT | 1 refills | Status: AC

## 2020-09-28 NOTE — Progress Notes (Signed)
Established patient visit   Patient: Andrew Estes   DOB: 10/30/1956   64 y.o. Male  MRN: [id number] Visit Date: 09/28/2020  Today's healthcare provider: Lelon Huh, MD   Chief Complaint  Patient presents with  . Stress  . Insomnia   Subjective    HPI  Situational stress, Follow-up  He was last seen for situational stress 7 months ago. Changes made at last visit include none; continue same medications.   He reports good compliance with treatment. He reports good tolerance of treatment. He is not having side effects.   He feels his anxiety is mild and Improved since last visit.  Symptoms: No chest pain No difficulty concentrating  No dizziness No fatigue  No feelings of losing control No insomnia  No irritable No palpitations  No panic attacks No racing thoughts  No shortness of breath No sweating  No tremors/shakes    GAD-7 Results GAD-7 Generalized Anxiety Disorder Screening Tool 09/28/2020  1. Feeling Nervous, Anxious, or on Edge 0  2. Not Being Able to Stop or Control Worrying 0  3. Worrying Too Much About Different Things 3  4. Trouble Relaxing 0  5. Being So Restless it's Hard To Sit Still 0  6. Becoming Easily Annoyed or Irritable 0  7. Feeling Afraid As If Something Awful Might Happen 0  Total GAD-7 Score 3  Difficulty At Work, Home, or Getting  Along With Others? Not difficult at all    PHQ-9 Scores PHQ9 SCORE ONLY 09/28/2020 09/28/2020 04/19/2020  PHQ-9 Total Score 0 0 0    ---------------------------------------------------------------------------------------------------  Follow up for insomnia:  The patient was last seen for this 7 months ago. Changes made at last visit include none; continue Trazodone as needed.  He reports good compliance with treatment. He feels that condition is Improved. He is not having side effects.   -----------------------------------------------------------------------------------------  He is also due  for follow up elevated PSA of 5.0 from 01/2020. He denies any urinary hesitancy, frequency or dysuria.   He is also due for follow up CKD. With last eGFR of 33 in February of 2021. He reports he drinks several glasses of water every day  He also request medication to help with ED which he has had more trouble with over the last few years.   He continues to smoke but feels his breathing is better using Symbicort every day. Not required rescue inhaler for long period of time.      Medications: Outpatient Medications Prior to Visit  Medication Sig  . albuterol (PROVENTIL HFA;VENTOLIN HFA) 108 (90 Base) MCG/ACT inhaler Inhale 2 puffs into the lungs every 6 (six) hours as needed for wheezing or shortness of breath.  . brimonidine (ALPHAGAN) 0.2 % ophthalmic solution 1 drop 2 (two) times daily.  . budesonide-formoterol (SYMBICORT) 160-4.5 MCG/ACT inhaler Inhale 2 puffs into the lungs 2 (two) times daily.  Marland Kitchen latanoprost (XALATAN) 0.005 % ophthalmic solution 1 drop at bedtime.  . sertraline (ZOLOFT) 100 MG tablet TAKE 1 TO 2 TABLETS BY MOUTH DAILY  . timolol (TIMOPTIC) 0.5 % ophthalmic solution 1 drop 2 (two) times daily.  . trazodone (DESYREL) 300 MG tablet TAKE 1 TO 1 AND 1/2 TABLETS(300 TO 450 MG) BY MOUTH AT BEDTIME AS NEEDED FOR SLEEP   No facility-administered medications prior to visit.    Review of Systems  Constitutional: Negative for appetite change, chills and fever.  Respiratory: Negative for chest tightness, shortness of breath and wheezing.   Cardiovascular: Negative for  chest pain and palpitations.  Gastrointestinal: Negative for abdominal pain, nausea and vomiting.      Objective    BP 116/75 (BP Location: Left Arm, Patient Position: Sitting, Cuff Size: Large)   Pulse 78   Temp 98.6 F (37 C) (Oral)   Resp 20   Wt 206 lb (93.4 kg)   BMI 27.94 kg/m    Physical Exam    General: Appearance:     Overweight male in no acute distress  Eyes:    PERRL,  conjunctiva/corneas clear, EOM's intact       Lungs:     Clear to auscultation bilaterally, respirations unlabored  Heart:    Normal heart rate. Normal rhythm. No murmurs, rubs, or gallops.   MS:   All extremities are intact.   Neurologic:   Awake, alert, oriented x 3. No apparent focal neurological           defect.          Assessment & Plan     1. Situational stress Doing very well with current sertraline dosage which he wishes to continue.   2. Insomnia, unspecified type Doing very well with prn trazodone with no adverse effects.   3. Stage 3b chronic kidney disease ([place]) Due to check   - Renal function panel  4. Elevated PSA Check - PSA Total (Reflex To Free)  5. Chronic obstructive pulmonary disease, unspecified COPD type ([place]) Stable. refill- budesonide-formoterol (SYMBICORT) 160-4.5 MCG/ACT inhaler; Inhale 2 puffs into the lungs 2 (two) times daily.  Dispense: 30.6 g; Refill: 1 Encouraged regular exercise   6. Tobacco abuse Encourage tobacco cessation. He was found to have 41m lung nodule on CT in 2019. He was referred to Dr. KMortimer Friesbut refused any additional testing at that time. Need to obtain follow up imaging. Lung CT for lung nodule follow up ordered.   7. Vitamin D deficiency - VITAMIN D 25 Hydroxy (Vit-D Deficiency, Fractures)  8. Erectile dysfunction, unspecified erectile dysfunction type  - sildenafil (VIAGRA) 100 MG tablet; Take 0.5-1 tablets (50-100 mg total) by mouth daily as needed for erectile dysfunction.  Dispense: 8 tablet; Refill: 4   No follow-ups on file.      The entirety of the information documented in the History of Present Illness, Review of Systems and Physical Exam were personally obtained by me. Portions of this information were initially documented by the CMA and reviewed by me for thoroughness and accuracy.      DLelon Huh MD  [phone](phone) [phone](fax)  [place]

## 2020-09-29 ENCOUNTER — Other Ambulatory Visit: Payer: Self-pay | Admitting: Family Medicine

## 2020-09-29 ENCOUNTER — Encounter: Payer: Self-pay | Admitting: Family Medicine

## 2020-09-29 DIAGNOSIS — R911 Solitary pulmonary nodule: Secondary | ICD-10-CM

## 2020-09-29 DIAGNOSIS — E559 Vitamin D deficiency, unspecified: Secondary | ICD-10-CM | POA: Insufficient documentation

## 2020-09-29 LAB — RENAL FUNCTION PANEL
Albumin: 4.5 g/dL (ref 3.8–4.8)
BUN/Creatinine Ratio: 15 (ref 10–24)
BUN: 28 mg/dL — ABNORMAL HIGH (ref 8–27)
CO2: 20 mmol/L (ref 20–29)
Calcium: 9.5 mg/dL (ref 8.6–10.2)
Chloride: 107 mmol/L — ABNORMAL HIGH (ref 96–106)
Creatinine, Ser: 1.89 mg/dL — ABNORMAL HIGH (ref 0.76–1.27)
GFR calc Af Amer: 42 mL/min/{1.73_m2} — ABNORMAL LOW (ref >59)
GFR calc non Af Amer: 37 mL/min/{1.73_m2} — ABNORMAL LOW (ref >59)
Glucose: 78 mg/dL (ref 65–99)
Phosphorus: 3.3 mg/dL (ref 2.8–4.1)
Potassium: 4.4 mmol/L (ref 3.5–5.2)
Sodium: 141 mmol/L (ref 134–144)

## 2020-09-29 LAB — PSA TOTAL (REFLEX TO FREE): Prostate Specific Ag, Serum: 2.5 ng/mL (ref 0.0–4.0)

## 2020-09-29 LAB — VITAMIN D 25 HYDROXY (VIT D DEFICIENCY, FRACTURES): Vit D, 25-Hydroxy: 20.4 ng/mL — ABNORMAL LOW (ref 30.0–100.0)

## 2020-09-30 ENCOUNTER — Telehealth: Payer: Self-pay | Admitting: Family Medicine

## 2020-09-30 DIAGNOSIS — R911 Solitary pulmonary nodule: Secondary | ICD-10-CM

## 2020-09-30 NOTE — Telephone Encounter (Signed)
There was an order placed for CT of chest LCS which is a lung cancer screening . Per CT department should just be CT chest without contrast,Thanks

## 2020-10-05 ENCOUNTER — Ambulatory Visit: Payer: Self-pay | Admitting: Family Medicine

## 2020-10-20 ENCOUNTER — Ambulatory Visit
Admission: RE | Admit: 2020-10-20 | Discharge: 2020-10-20 | Disposition: A | Discharge status: Home / Self Care | Payer: Medicare HMO | Source: Ambulatory Visit | Attending: Family Medicine | Admitting: Family Medicine

## 2020-10-20 ENCOUNTER — Other Ambulatory Visit: Payer: Self-pay

## 2020-10-20 DIAGNOSIS — R911 Solitary pulmonary nodule: Secondary | ICD-10-CM

## 2020-10-20 DIAGNOSIS — J9811 Atelectasis: Secondary | ICD-10-CM | POA: Diagnosis not present

## 2020-10-20 DIAGNOSIS — I313 Pericardial effusion (noninflammatory): Secondary | ICD-10-CM | POA: Diagnosis not present

## 2020-10-20 DIAGNOSIS — J90 Pleural effusion, not elsewhere classified: Secondary | ICD-10-CM | POA: Diagnosis not present

## 2020-10-20 DIAGNOSIS — J432 Centrilobular emphysema: Secondary | ICD-10-CM | POA: Diagnosis not present

## 2020-10-21 ENCOUNTER — Telehealth: Payer: Self-pay

## 2020-10-21 NOTE — Telephone Encounter (Signed)
Olivia Mackie with [facility] calling to report pt.'s Chest CT is in Epic. Jiles Garter in the practice notified.

## 2020-10-25 NOTE — Telephone Encounter (Signed)
Pt calling and is requesting to have the results of his CT scan. Please advise.

## 2020-10-25 NOTE — Telephone Encounter (Signed)
Please advise results? 

## 2020-10-26 ENCOUNTER — Other Ambulatory Visit: Payer: Self-pay | Admitting: Family Medicine

## 2020-10-26 DIAGNOSIS — T8182XA Emphysema (subcutaneous) resulting from a procedure, initial encounter: Secondary | ICD-10-CM | POA: Insufficient documentation

## 2020-10-26 DIAGNOSIS — J90 Pleural effusion, not elsewhere classified: Secondary | ICD-10-CM | POA: Insufficient documentation

## 2020-10-26 DIAGNOSIS — I313 Pericardial effusion (noninflammatory): Secondary | ICD-10-CM

## 2020-10-26 DIAGNOSIS — R918 Other nonspecific abnormal finding of lung field: Secondary | ICD-10-CM | POA: Insufficient documentation

## 2020-10-26 DIAGNOSIS — Z720 Tobacco use: Secondary | ICD-10-CM

## 2020-10-26 DIAGNOSIS — I3139 Other pericardial effusion (noninflammatory): Secondary | ICD-10-CM

## 2020-12-22 ENCOUNTER — Encounter: Payer: Self-pay | Admitting: Internal Medicine

## 2020-12-22 ENCOUNTER — Other Ambulatory Visit: Payer: Self-pay

## 2020-12-22 ENCOUNTER — Ambulatory Visit (INDEPENDENT_AMBULATORY_CARE_PROVIDER_SITE_OTHER): Payer: Medicare HMO | Admitting: Internal Medicine

## 2020-12-22 VITALS — BP 132/80 | HR 83 | Temp 97.3°F | Ht 71.0 in | Wt 213.2 lb

## 2020-12-22 DIAGNOSIS — R918 Other nonspecific abnormal finding of lung field: Secondary | ICD-10-CM

## 2020-12-22 DIAGNOSIS — J449 Chronic obstructive pulmonary disease, unspecified: Secondary | ICD-10-CM

## 2020-12-22 DIAGNOSIS — F17210 Nicotine dependence, cigarettes, uncomplicated: Secondary | ICD-10-CM | POA: Diagnosis not present

## 2020-12-22 NOTE — Progress Notes (Signed)
Name: Andrew Estes MRN: [id number] DOB: 05/22/1956     CONSULTATION DATE: 3.25.19 REFERRING MD : Caryn Section   CHIEF COMPLAINT: Shortness of breath  STUDIES:  CT chest Independently reviewed-2.25.19 Right-sided pleural effusion noted small to moderate  PREVIOUS OV 65 year old pleasant white male morbidly obese seen today for assessment of COPD Patient is a chronic smoker 1 pack a day for approximately 53 years Currently disabled since 2011 from a broken back  Patient states he was diagnosed with COPD several years ago when he was admitted for pneumonia   CC Follow up COPD Follow up abnormal CT chest  HPI I have reviewed the CT scan with the patient and I have noted and relayed to the patient that there is a small to moderate effusion with underlying RLL lung mass  This is a significant change from a CT scan from 2 years ago Patient actively smokes 1 pack a day  Patient weighed 336 pounds several years ago is down to 213 pounds at this time   CT chest 10/2020 Slight interval decrease of a trace right pleural effusion with associated increased right lower lobe atelectasis-patient may have underlying lung mass as well Findings reviewed with patient   This time I have explained to patient that findings can be concerning for lung mass and lung cancer however he does not want any type of procedures at this time and does not want to be put to sleep and would rather have a repeat CT scan at this time  Regarding COPD patient does not seem to have any type of exacerbation at this time Patient has intermittent shortness of breath and chronic dyspnea on exertion No signs of exacerbation at this time  No exacerbation at this time No evidence of heart failure at this time No evidence or signs of infection at this time No respiratory distress No fevers, chills, nausea, vomiting, diarrhea No evidence of lower extremity edema No evidence hemoptysis   Currently uses Symbicort  and seems to help   PAST MEDICAL HISTORY :   has a past medical history of Cataract, COPD (chronic obstructive pulmonary disease) ([place]), Glaucoma, Hyperlipidemia, Obesity, and Renal failure, unspecified (06/15/2012).  has a past surgical history that includes Back surgery and Glaucoma surgery (Right, 06/2020). Prior to Admission medications   Medication Sig Start Date End Date Taking? Authorizing Provider  albuterol (PROVENTIL HFA;VENTOLIN HFA) 108 (90 BASE) MCG/ACT inhaler Inhale 2 puffs into the lungs every 6 (six) hours as needed for wheezing or shortness of breath.    [provider]  budesonide-formoterol (SYMBICORT) 160-4.5 MCG/ACT inhaler Inhale 2 puffs into the lungs 2 (two) times daily. 01/10/18   [name], MD  sertraline (ZOLOFT) 100 MG tablet Take 1 tablet (100 mg total) by mouth daily. 02/21/18   [name], MD  traZODone (DESYREL) 150 MG tablet Take 1 tablet (150 mg total) by mouth at bedtime as needed for sleep. 02/21/18   [name], MD   Allergies  Allergen Reactions  . Oxycontin [Oxycodone Hcl] Nausea And Vomiting  . Tylenol With Codeine #3 [Acetaminophen-Codeine] Nausea And Vomiting  . Levofloxacin Rash and Other (See Comments)    REACTION: ORAL rash (breakout) "caused right eye vision problems and taste bud problem"    FAMILY HISTORY:  family history includes Cancer (age of onset: 17) in his mother; Diverticulitis in his mother; Osteoarthritis in his father. SOCIAL HISTORY:  reports that he has been smoking cigarettes. He has a 156.00 pack-year smoking history. He  has never used smokeless tobacco. He reports that he does not drink alcohol and does not use drugs.   Review of Systems:  Gen:  Denies  fever, sweats, chills weight loss  HEENT: Denies blurred vision, double vision, ear pain, eye pain, hearing loss, nose bleeds, sore throat Cardiac:  No dizziness, chest pain or heaviness, chest tightness,edema, No JVD Resp:   No cough, -sputum  production, -shortness of breath,-wheezing, -hemoptysis,  Gi: Denies swallowing difficulty, stomach pain, nausea or vomiting, diarrhea, constipation, bowel incontinence Gu:  Denies bladder incontinence, burning urine Ext:   Denies Joint pain, stiffness or swelling Skin: Denies  skin rash, easy bruising or bleeding or hives Endoc:  Denies polyuria, polydipsia , polyphagia or weight change Psych:   Denies depression, insomnia or hallucinations  Other:  All other systems negative   ALL OTHER ROS ARE NEGATIVE   BP 132/80 (BP Location: Left Arm, Cuff Size: Normal)   Pulse 83   Temp (!) 97.3 F (36.3 C) (Temporal)   Ht 5\' 11"  (1.803 m)   Wt 213 lb 3.2 oz (96.7 kg)   SpO2 97%   BMI 29.74 kg/m    Physical Examination:   General Appearance: No distress  Neuro:without focal findings,  speech normal,  HEENT: PERRLA, EOM intact.   Pulmonary: normal breath sounds, No wheezing.  CardiovascularNormal S1,S2.  No m/r/g.   Abdomen: Benign, Soft, non-tender. Renal:  No costovertebral tenderness  GU:  Not performed at this time. Endoc: No evident thyromegaly Skin:   warm, no rashes, no ecchymosis  Extremities: normal, no cyanosis, clubbing. PSYCHIATRIC: Mood, affect within normal limits.   ALL OTHER ROS ARE NEGATIVE      ASSESSMENT / PLAN:  65 year old white male with significant smoking history approximately 1 pack a day for 50 years has chronic shortness of breath and chronic dyspnea on exertion with wheezing in the setting of deconditioned state patient has underlying signs and symptoms of COPD with an abnormal CT chest finding that shows small right pleural effusion along with a abnormal density in the right lower lobe which could be a lung mass  This time I explained to patient that options include needle biopsies and bronchoscopies however at this time patient does not want to have any type of procedure at this time and he would like to have a repeat CT chest.  However in the  setting of probable and likelihood of lung mass I would recommend a PET scan to assess uptake and therefore discuss further intervention at that time  Patient refusing to undergo any type of diagnostic or therapeutic thoracentesis at this time    #1 shortness of breath dyspnea on exertion is multifactorial related  Patient with underlying COPD and deconditioned state and active smoking    #2  COPD Continue Symbicort as prescribed Albuterol as needed No signs of exacerbation at this time  #3 smoking cessation strongly advised  #4 pleural effusion with right lower lobe lung density probable lung mass Recommend PET scan for further assessment     COVID-19 EDUCATION: The signs and symptoms of COVID-19 were discussed with the patient and how to seek care for testing.  The importance of social distancing was discussed today. Hand Washing Techniques and avoid touching face was advised.     MEDICATION ADJUSTMENTS/LABS AND TESTS ORDERED:  Continue Symbicort PET scan ordered Focus of session  CURRENT MEDICATIONS REVIEWED AT LENGTH WITH PATIENT TODAY   Patient satisfied with Plan of action and management. All questions answered  Follow  up in 3 months  TOTAL  TIME SPENT 35 minutes   Corrin Parker, M.D.  [facility]  Medical Director [place] Director [facility] Cardio-Pulmonary Department

## 2020-12-22 NOTE — Patient Instructions (Signed)
Recommend PET scan for Lung mass Continue Inhalers as prescribed Please stop smoking

## 2021-01-06 ENCOUNTER — Other Ambulatory Visit: Payer: Self-pay | Admitting: Family Medicine

## 2021-01-06 DIAGNOSIS — F439 Reaction to severe stress, unspecified: Secondary | ICD-10-CM

## 2021-01-18 ENCOUNTER — Other Ambulatory Visit: Payer: Self-pay

## 2021-01-18 ENCOUNTER — Ambulatory Visit
Admission: RE | Admit: 2021-01-18 | Discharge: 2021-01-18 | Disposition: A | Discharge status: Home / Self Care | Payer: Medicare HMO | Source: Ambulatory Visit | Attending: Internal Medicine | Admitting: Internal Medicine

## 2021-01-18 DIAGNOSIS — R918 Other nonspecific abnormal finding of lung field: Secondary | ICD-10-CM | POA: Diagnosis not present

## 2021-01-18 LAB — GLUCOSE, CAPILLARY: Glucose-Capillary: 75 mg/dL (ref 70–99)

## 2021-01-18 MED ORDER — FLUDEOXYGLUCOSE F - 18 (FDG) INJECTION
11.0000 | Freq: Once | INTRAVENOUS | Status: AC | PRN
  Administered 2021-01-18: 11.11 via INTRAVENOUS

## 2021-02-02 ENCOUNTER — Telehealth: Payer: Self-pay | Admitting: Internal Medicine

## 2021-02-02 DIAGNOSIS — R918 Other nonspecific abnormal finding of lung field: Secondary | ICD-10-CM

## 2021-02-02 NOTE — Telephone Encounter (Signed)
Per Dr. Mortimer Fries verbally- placed referral to IR for bx.   Lm for patient.

## 2021-02-03 NOTE — Telephone Encounter (Signed)
Spoke to patient and relayed below message.  Patient would like to discuss results with Dr. Mortimer Fries prior to agreeing with bx. Patient's contact number is [phone].  Dr. Mortimer Fries, please advise. thanks

## 2021-02-08 ENCOUNTER — Telehealth: Payer: Self-pay | Admitting: Internal Medicine

## 2021-02-08 NOTE — Telephone Encounter (Addendum)
Patient is calling for update on PET results.   Dr. Mortimer Fries, please advise. Thanks

## 2021-02-08 NOTE — Telephone Encounter (Signed)
Dr. Mortimer Fries, please advise. thanks

## 2021-02-08 NOTE — Telephone Encounter (Signed)
Please see 02/02/2021 phone note.

## 2021-02-11 ENCOUNTER — Other Ambulatory Visit: Payer: Self-pay | Admitting: Family Medicine

## 2021-02-11 DIAGNOSIS — F439 Reaction to severe stress, unspecified: Secondary | ICD-10-CM

## 2021-02-11 MED ORDER — SERTRALINE HCL 100 MG PO TABS: 100.0000 mg | ORAL_TABLET | Freq: Every day | ORAL | 2 refills | Status: DC

## 2021-02-11 NOTE — Telephone Encounter (Signed)
Requested Prescriptions  Pending Prescriptions Disp Refills  . sertraline (ZOLOFT) 100 MG tablet 60 tablet 2    Sig: Take 1-2 tablets (100-200 mg total) by mouth daily.     Psychiatry:  Antidepressants - SSRI Passed - 02/11/2021  3:27 PM      Passed - Valid encounter within last 6 months    Recent Outpatient Visits          4 months ago Stage 3b chronic kidney disease ([place])   [place], [name], MD   1 year ago Stage 3b chronic kidney disease   [facility] [name], MD   1 year ago Insomnia, unspecified type   [facility] [name], MD   1 year ago Chronic kidney disease (CKD), stage III (moderate) [facility])   [facility] [name], MD   2 years ago Cough   [facility] [name], MD      Future Appointments            In 1 month Flora Lipps, MD [facility] [place]   In 1 month Fisher, Kirstie Peri, MD [facility] [place]

## 2021-02-11 NOTE — Telephone Encounter (Signed)
Copied from [place] [id number]. Topic: Quick Communication - Rx Refill/Question >> Feb 11, 2021  3:07 PM [name] A wrote: Medication: sertraline (ZOLOFT) 100 MG tablet - patient has 0(zero) tablets remaining  Has the patient contacted their pharmacy? Yes. Patient has contacted pharmacy and encountered difficulty getting prescription filled by pharmacy.   Preferred Pharmacy (with phone number or street name): [id number] [name], [place]  Phone:  [phone]  Agent: Please be advised that RX refills may take up to 3 business days. We ask that you follow-up with your pharmacy.

## 2021-02-14 ENCOUNTER — Other Ambulatory Visit: Payer: Self-pay | Admitting: Internal Medicine

## 2021-02-14 DIAGNOSIS — R918 Other nonspecific abnormal finding of lung field: Secondary | ICD-10-CM

## 2021-02-14 DIAGNOSIS — R160 Hepatomegaly, not elsewhere classified: Secondary | ICD-10-CM

## 2021-02-14 NOTE — Addendum Note (Signed)
Addended by: Claudette Head A on: 02/14/2021 03:04 PM   Modules accepted: Orders

## 2021-02-14 NOTE — Telephone Encounter (Signed)
Noted  

## 2021-02-14 NOTE — Progress Notes (Unsigned)
I have updated Andrew Estes and will order liver biopsy.

## 2021-02-14 NOTE — Telephone Encounter (Signed)
Dr. Mortimer Fries, please advise.

## 2021-02-14 NOTE — Telephone Encounter (Signed)
Hi I have updated patient and he understands plan of care and would like to proceed with lover biopsy as previously ordered

## 2021-02-14 NOTE — Telephone Encounter (Signed)
Hi Margie, patient will need CT guided LIVER BIOPSY, I will place that order

## 2021-02-14 NOTE — Telephone Encounter (Signed)
Referral has been placed. Nothing further needed.

## 2021-02-15 ENCOUNTER — Other Ambulatory Visit: Payer: Self-pay

## 2021-02-15 ENCOUNTER — Other Ambulatory Visit: Payer: Self-pay | Admitting: Internal Medicine

## 2021-02-15 DIAGNOSIS — R160 Hepatomegaly, not elsewhere classified: Secondary | ICD-10-CM

## 2021-02-16 NOTE — Progress Notes (Signed)
Patient on schedule for Liver biopsy 02/21/2021. Spoke with patient on phone with pre procedure instructions given. Made aware to be here @ 0900, NPO after MN prior to procedure and driver for post procedure/discharge. Stated understanding.

## 2021-02-18 ENCOUNTER — Other Ambulatory Visit: Payer: Self-pay | Admitting: Student

## 2021-02-21 ENCOUNTER — Ambulatory Visit
Admission: RE | Admit: 2021-02-21 | Discharge: 2021-02-21 | Disposition: A | Discharge status: Home / Self Care | Payer: Medicare HMO | Source: Ambulatory Visit | Attending: Internal Medicine | Admitting: Internal Medicine

## 2021-02-21 ENCOUNTER — Other Ambulatory Visit: Payer: Self-pay

## 2021-02-21 DIAGNOSIS — K769 Liver disease, unspecified: Secondary | ICD-10-CM | POA: Insufficient documentation

## 2021-02-21 DIAGNOSIS — R160 Hepatomegaly, not elsewhere classified: Secondary | ICD-10-CM | POA: Insufficient documentation

## 2021-02-21 DIAGNOSIS — C787 Secondary malignant neoplasm of liver and intrahepatic bile duct: Secondary | ICD-10-CM | POA: Diagnosis not present

## 2021-02-21 DIAGNOSIS — K7689 Other specified diseases of liver: Secondary | ICD-10-CM | POA: Diagnosis not present

## 2021-02-21 LAB — CBC
HCT: 41.6 % (ref 39.0–52.0)
Hemoglobin: 13.6 g/dL (ref 13.0–17.0)
MCH: 32.2 pg (ref 26.0–34.0)
MCHC: 32.7 g/dL (ref 30.0–36.0)
MCV: 98.6 fL (ref 80.0–100.0)
Platelets: 151 10*3/uL (ref 150–400)
RBC: 4.22 MIL/uL (ref 4.22–5.81)
RDW: 13.6 % (ref 11.5–15.5)
WBC: 6.6 10*3/uL (ref 4.0–10.5)
nRBC: 0 % (ref 0.0–0.2)

## 2021-02-21 LAB — PROTIME-INR
INR: 1 (ref 0.8–1.2)
Prothrombin Time: 12.9 seconds (ref 11.4–15.2)

## 2021-02-21 MED ORDER — FENTANYL CITRATE (PF) 100 MCG/2ML IJ SOLN
INTRAMUSCULAR | Status: AC | PRN
  Administered 2021-02-21 (×2): 50 ug via INTRAVENOUS

## 2021-02-21 MED ORDER — MIDAZOLAM HCL 2 MG/2ML IJ SOLN
INTRAMUSCULAR | Status: AC | PRN
  Administered 2021-02-21 (×2): 1 mg via INTRAVENOUS

## 2021-02-21 MED ORDER — SODIUM CHLORIDE 0.9 % IV SOLN: INTRAVENOUS | Status: DC

## 2021-02-21 MED ORDER — FENTANYL CITRATE (PF) 100 MCG/2ML IJ SOLN
INTRAMUSCULAR | Status: AC
  Filled 2021-02-21: qty 2

## 2021-02-21 MED ORDER — MIDAZOLAM HCL 5 MG/5ML IJ SOLN
INTRAMUSCULAR | Status: AC
  Filled 2021-02-21: qty 5

## 2021-02-21 NOTE — Progress Notes (Signed)
Patient anxious to leave post procedure. Discussed with patient the risk of leaving before discharge. Radiology PA, Anderson Malta, at bedside discussing risks of leaving before recovery complete as well. Patient made decision to leave against medical advice, signed paperwork. This RN called patient's wife to inform her of patient's decision/actions. Wife apologetic and stated she would arrive at medical mall shortly to pick up patient.

## 2021-02-21 NOTE — H&P (Signed)
Chief Complaint: Patient was seen in consultation today for image guided liver lesion biopsy at the request of Ernest  Referring Physician(s): [place]  Supervising Physician: Aletta Edouard  Patient Status: [facility] - Out-pt  History of Present Illness: Andrew Estes is a 65 y.o. male new to our service.  His past medical history includes hyperlipidemia, renal failure, and COPD which has been  followed by pulmonary.  He had a follow up CT on 10/21/20 which showed: trace right pleural effusion with associated increased right lower lobe atelectasis, underlying infection/inflammation or even mass cannot be excluded. The result was discussed with the patient; however, he declined any type of procedures.  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 Again, the result was discussed with the patient, and he is agreeable to proceed with biopsy of the liver mass.  IR was requested for image guided liver lesion biopsy.   Patient laying in bed, not in acute distress.  Denise headache, fever, chills, shortness of breath, cough, chest pain, abdominal pain, nausea ,vomiting, and bleeding.    Past Medical History:  Diagnosis Date  . Cataract   . COPD (chronic obstructive pulmonary disease) ([place])   . Glaucoma   . Hyperlipidemia   . Obesity   . Renal failure, unspecified 06/15/2012    Past Surgical History:  Procedure Laterality Date  . BACK SURGERY     has had a total of 5 back surgeries  . GLAUCOMA SURGERY Right 06/2020   Dr. Dorna Bloom  . TONSILLECTOMY      Allergies: Morphine and related, Oxycontin [oxycodone hcl], Tylenol with codeine #3 [acetaminophen-codeine], and  Levofloxacin  Medications: Prior to Admission medications   Medication Sig Start Date End Date Taking? Authorizing Provider  albuterol (PROVENTIL HFA;VENTOLIN HFA) 108 (90 Base) MCG/ACT inhaler Inhale 2 puffs into the lungs every 6 (six) hours as needed for wheezing or shortness of breath. Patient not taking: Reported on 12/22/2020 02/25/18   Flora Lipps, MD  brimonidine (ALPHAGAN) 0.2 % ophthalmic solution 1 drop 2 (two) times daily. Patient not taking: Reported on 12/22/2020 04/16/20   [provider]  brimonidine-timolol (COMBIGAN) 0.2-0.5 % ophthalmic solution Place 1 drop into both eyes 2 (two) times daily. 05/14/20   [provider]  budesonide-formoterol (SYMBICORT) 160-4.5 MCG/ACT inhaler Inhale 2 puffs into the lungs 2 (two) times daily. 09/28/20   [name], MD  latanoprost (XALATAN) 0.005 % ophthalmic solution 1 drop at bedtime. 04/16/20   [provider]  sertraline (ZOLOFT) 100 MG tablet Take 1-2 tablets (100-200 mg total) by mouth daily. 02/11/21   [name], MD  sildenafil (VIAGRA) 100 MG tablet Take 0.5-1 tablets (50-100 mg total) by mouth daily as needed for erectile dysfunction. 09/28/20   [name], MD  timolol (TIMOPTIC) 0.5 % ophthalmic solution 1 drop 2 (two) times daily. Patient not taking: Reported on 12/22/2020 04/16/20   [provider]  trazodone (DESYREL) 300 MG tablet TAKE 1 TO 1 AND 1/2 TABLETS(300 TO 450 MG) BY MOUTH AT BEDTIME AS NEEDED FOR SLEEP 09/27/20   [name], MD     Family History  Problem Relation Age of Onset  . Diverticulitis Mother   . Cancer Mother 41       pancreas  .  Osteoarthritis Father     Social History   Socioeconomic History  . Marital status: Married    Spouse name: Not on file  . Number of children: 1  . Years of education: Not on file  . Highest education level: High school graduate  Occupational History  . Occupation: Educational psychologist: [employer]    Comment: on workers comp x 2.5 yrs  . Occupation: disability  Tobacco Use  . Smoking status: Current Every Day Smoker    Packs/day: 0.50    Years: 52.00    Pack years: 26.00    Types: Cigarettes  . Smokeless tobacco: Never Used  . Tobacco comment: 1PPD 12/22/2020  Vaping Use  . Vaping Use: Never used  Substance and Sexual Activity  . Alcohol use: No  . Drug use: No  . Sexual activity: Yes  Other Topics Concern  . Not on file  Social History Narrative  . Not on file   Social Determinants of Health   Financial Resource Strain: Low Risk   . Difficulty of Paying Living Expenses: Not hard at all  Food Insecurity: No Food Insecurity  . Worried About Charity fundraiser in the Last Year: Never true  . Ran Out of Food in the Last Year: Never true  Transportation Needs: No Transportation Needs  . Lack of Transportation (Medical): No  . Lack of Transportation (Non-Medical): No  Physical Activity: Inactive  . Days of Exercise per Week: 0 days  . Minutes of Exercise per Session: 0 min  Stress: Stress Concern Present  . Feeling of Stress : To some extent  Social Connections: Socially Isolated  . Frequency of Communication with Friends and Family: Twice a week  . Frequency of Social Gatherings with Friends and Family: More than three times a week  . Attends Religious Services: Never  . Active Member of Clubs or Organizations: No  . Attends Archivist Meetings: Never  . Marital Status: Divorced     Review of Systems: A 12 point ROS discussed and pertinent positives are indicated in the HPI above.  All other systems are negative.   Vital Signs: BP 139/89   Pulse 69   Temp 98.4 F (36.9 C) (Oral)   Resp 19   Ht 6' (1.829 m)   Wt 200 lb (90.7 kg)   SpO2 100%   BMI 27.12 kg/m   Physical Exam Vitals and nursing note reviewed.  Constitutional:      General: He is not in acute distress.    Appearance: Normal appearance. He is not ill-appearing.   Cardiovascular:     Rate and Rhythm: Normal rate and regular rhythm.     Pulses: Normal pulses.     Heart sounds: Normal heart sounds.  Pulmonary:     Effort: Pulmonary effort is normal.     Breath sounds: Normal breath sounds.  Abdominal:     General: Bowel sounds are normal.     Palpations: Abdomen is soft.  Neurological:     Mental Status: He is alert and oriented to person, place, and time.  Psychiatric:        Mood and Affect: Mood normal.        Behavior: Behavior normal.        Imaging: No results found.  Labs:  CBC: Recent Labs    02/21/21 0931  WBC 6.6  HGB 13.6  HCT 41.6  PLT 151    COAGS: No results for input(s):  INR, APTT in the last 8760 hours.  BMP: Recent Labs    09/28/20 1520  NA 141  K 4.4  CL 107*  CO2 20  GLUCOSE 78  BUN 28*  CALCIUM 9.5  CREATININE 1.89*  GFRNONAA 37*  GFRAA 42*    LIVER FUNCTION TESTS: Recent Labs    09/28/20 1520  ALBUMIN 4.5    TUMOR MARKERS: No results for input(s): AFPTM, CEA, CA199, CHROMGRNA in the last 8760 hours.  Assessment and Plan: 65 y.o. 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  Patient was referred to IR for image guided liver lesion biopsy.  He presents to IR today for the procedure.  NPO since midnight. INR pending, not on blood thinner.   Risks and benefits of liver lesion biopsy was discussed with the patient and/or patient's family including, but not limited to bleeding, infection, damage to adjacent structures or low yield requiring additional tests.  All of the questions were answered and there is agreement to proceed.  Consent signed and in chart.   Thank you for this interesting consult.  I greatly enjoyed meeting JUNIE ENGRAM and look forward to participating in their care.  A copy of this report was sent  to the requesting provider on this date.  Electronically Signed: Tera Mater, PA-C 02/21/2021, 9:43 AM   I spent a total of  30 Minutes   in face to face in clinical consultation, greater than 50% of which was counseling/coordinating care for image guided liver lesion biopsy.

## 2021-02-21 NOTE — Progress Notes (Signed)
Patient requesting to leave AMA at this time. Patient states that he does not want to "stay here until 1". He states "I think I will be fine". Vital signs WNL. Patient made aware of  risks including possible  death. Patient verbally states that he is willing to accept those risks and sign AMA form. Patient instructed to call 911 should any issues arise. IR Attending Dr. Pilar Jarvis made aware.

## 2021-02-21 NOTE — Progress Notes (Signed)
Patient clinically stable post Liver Biopsy per Dr Kathlene Cote, tolerated well. Awake/alert and oriented post procedure. Denies complaints at this time. Received Versed 2 mg along with Fentanyl 100 mcg IV for procedure. Report given to [facility] Rn post procedure/recovery/specials.

## 2021-02-21 NOTE — Procedures (Signed)
Interventional Radiology Procedure Note  Procedure: US Guided Biopsy of right lobe liver lesion  Complications: None  Estimated Blood Loss: < 10 mL  Findings: 18 G core biopsy of right lobe liver lesion performed under US guidance.  Three core samples obtained and sent to Pathology.  Venetia Night. Kathlene Cote, M.D Pager:  [phone]

## 2021-02-23 ENCOUNTER — Other Ambulatory Visit: Payer: Self-pay | Admitting: Anatomic Pathology & Clinical Pathology

## 2021-02-23 LAB — SURGICAL PATHOLOGY

## 2021-02-23 NOTE — Progress Notes (Unsigned)
MDT

## 2021-02-25 ENCOUNTER — Telehealth: Payer: Self-pay | Admitting: Internal Medicine

## 2021-02-25 DIAGNOSIS — C3490 Malignant neoplasm of unspecified part of unspecified bronchus or lung: Secondary | ICD-10-CM

## 2021-02-25 NOTE — Telephone Encounter (Signed)
Referral has been placed to Oncology.  Nothing further is needed at this time.

## 2021-02-25 NOTE — Telephone Encounter (Signed)
This is a patient of Dr. Mortimer Fries whom I have never seen, Dr. Mortimer Fries should discuss these findings with him.  If the patient is not willing to wait I can talk to him if needed.

## 2021-02-25 NOTE — Telephone Encounter (Signed)
Spoke to patient and relayed below message.  Patient would like results today.

## 2021-02-25 NOTE — Telephone Encounter (Signed)
I have spoken to the patient.  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  He will need evaluation by oncology.  He is aware of the referral.  I did let him know that [facility] is the navigator for lung cancer patients at the cancer center and she may be reaching out to him.  Please note that the patient's primary pulmonologist is Dr. Mortimer Fries.

## 2021-02-25 NOTE — Telephone Encounter (Signed)
Called and spoke to patient, who is requesting bx results. He would like results today, as he is anxious. Dr. Mortimer Fries is unavailable.   Dr. Patsey Berthold, please advise. Thanks

## 2021-02-28 ENCOUNTER — Inpatient Hospital Stay: Payer: Medicare HMO | Attending: Oncology | Admitting: Oncology

## 2021-02-28 ENCOUNTER — Inpatient Hospital Stay: Payer: Medicare HMO

## 2021-02-28 ENCOUNTER — Encounter: Payer: Self-pay | Admitting: Oncology

## 2021-02-28 ENCOUNTER — Other Ambulatory Visit: Payer: Self-pay

## 2021-02-28 ENCOUNTER — Encounter: Payer: Self-pay | Admitting: *Deleted

## 2021-02-28 VITALS — BP 118/80 | HR 66 | Temp 98.1°F | Resp 16 | Ht 72.0 in | Wt 199.0 lb

## 2021-02-28 DIAGNOSIS — C787 Secondary malignant neoplasm of liver and intrahepatic bile duct: Secondary | ICD-10-CM

## 2021-02-28 DIAGNOSIS — F17210 Nicotine dependence, cigarettes, uncomplicated: Secondary | ICD-10-CM | POA: Diagnosis not present

## 2021-02-28 DIAGNOSIS — Z806 Family history of leukemia: Secondary | ICD-10-CM

## 2021-02-28 DIAGNOSIS — C3490 Malignant neoplasm of unspecified part of unspecified bronchus or lung: Secondary | ICD-10-CM | POA: Insufficient documentation

## 2021-02-28 DIAGNOSIS — Z7189 Other specified counseling: Secondary | ICD-10-CM | POA: Diagnosis not present

## 2021-02-28 DIAGNOSIS — Z800 Family history of malignant neoplasm of digestive organs: Secondary | ICD-10-CM | POA: Insufficient documentation

## 2021-02-28 NOTE — Progress Notes (Signed)
For over 2-3 years he eats once a day and lost wt because he wanted to loose wt. He was at 337 lbs then. He occ. Has snacks, and he does drink liquids. He has occ. Chest pain on right side but not any now.

## 2021-02-28 NOTE — Progress Notes (Signed)
  Oncology Nurse Navigator Documentation  Navigator Location: [facility] (02/28/21 1200) Referral Date to RadOnc/MedOnc: 02/25/21 (02/28/21 1200) )Navigator Encounter Type: Initial MedOnc (02/28/21 1200)   Abnormal Finding Date: 01/18/21 (02/28/21 1200) Confirmed Diagnosis Date: 02/23/21 (02/28/21 1200)                 Treatment Phase: Pre-Tx/Tx Discussion (02/28/21 1200) Barriers/Navigation Needs: Coordination of Care;Education;Financial Toxicity (02/28/21 1200) Education: Newly Diagnosed Cancer Education;Understanding Cancer/ Treatment Options (02/28/21 1200) Interventions: Coordination of Care;Education;Referrals (02/28/21 1200) Referrals: Social Work (02/28/21 1200) Coordination of Care: Appts;Radiology (02/28/21 1200) Education Method: Verbal;Written (02/28/21 1200)      Acuity: Level 3-Moderate Needs (3-4 Barriers Identified) (02/28/21 1200)    met with patient and his wife during initial consult with Dr. Janese Banks. All questions answered during visit. Pt given resources regarding diagnosis and supportive services available. Reviewed upcoming appts with patient. Will follow up with pt by phone on Thursday morning to discuss next steps. Contact info given and instructed to call with any questions or needs. Pt and his wife verbalized understanding.      Time Spent with Patient: 60 (02/28/21 1200)

## 2021-02-28 NOTE — Progress Notes (Signed)
Hematology/Oncology Consult note [facility] [phone] [phone]  Patient Care Team: [name], MD as PCP - General (Family Medicine) Eulogio Bear, MD as Consulting Physician (Ophthalmology) Klifto, Lindajo Royal, MD as Referring Physician (Glaucoma Ophthalmology) Flora Lipps, MD as Consulting Physician (Pulmonary Disease) Telford Nab, RN as Oncology Nurse Navigator   Name of the patient: Andrew Estes  [id number]  10-04-1956    Reason for referral-new diagnosis of small cell lung cancer   Referring physician-Dr. Patsey Berthold  Date of visit: 02/28/21   History of presenting illness- Patient is a 65 year old male with a 52-pack-year smoking history who had a CT chest without contrast in November 2021 to follow-up on a prior lung nodule..  CT showed prior granulomatous disease with no pulmonary nodules requiring follow-up.  Interval decrease in the right pleural effusion.  Underlying infection/inflammation not excluded.  This was followed by a PET CT scan which showed hypermetabolic left suprahilar nodularity measuring 2 cm with an SUV of 8.7.  Lobular hypermetabolic left upper lobe lung mass with an SUV of 9.9.  Calcified right lower paratracheal lymph nodes with no significant metabolic activity.  For hypermetabolic liver lesions were seen concerning for liver metastases.  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  Cells were positive for CD56 Ki-67 greater than 90%.  Patient is here to discuss further management.  He is here with his wife today.  He reports mild ongoing fatigue.  He is independent of his ADLs.  Denies any significant pain at this time  ECOG PS- 1  Pain scale- 0   Review of systems- Review of Systems  Constitutional: Positive for malaise/fatigue. Negative for chills, fever and weight loss.  HENT: Negative for  congestion, ear discharge and nosebleeds.   Eyes: Negative for blurred vision.  Respiratory: Negative for cough, hemoptysis, sputum production, shortness of breath and wheezing.   Cardiovascular: Negative for chest pain, palpitations, orthopnea and claudication.  Gastrointestinal: Negative for abdominal pain, blood in stool, constipation, diarrhea, heartburn, melena, nausea and vomiting.  Genitourinary: Negative for dysuria, flank pain, frequency, hematuria and urgency.  Musculoskeletal: Negative for back pain, joint pain and myalgias.  Skin: Negative for rash.  Neurological: Negative for dizziness, tingling, focal weakness, seizures, weakness and headaches.  Endo/Heme/Allergies: Does not bruise/bleed easily.  Psychiatric/Behavioral: Negative for depression and suicidal ideas. The patient does not have insomnia.     Allergies  Allergen Reactions  . Morphine And Related Other (See Comments)    Patient states he "gets violent on morphine"  . Oxycontin [Oxycodone Hcl] Nausea And Vomiting  . Tylenol With Codeine #3 [Acetaminophen-Codeine] Nausea And Vomiting  . Levofloxacin Rash and Other (See Comments)    REACTION: ORAL rash (breakout) "caused right eye vision problems and taste bud problem"    Patient Active Problem List   Diagnosis Date Noted  . Pericardial effusion 10/26/2020  . Subcutaneous emphysema resulting from a procedure 10/26/2020  . Multiple lung nodules 10/26/2020  . Pleural effusion on right 10/26/2020  . Vitamin D deficiency 09/29/2020  . Eczema 12/20/2018  . Sleep apnea 06/11/2018  . Insomnia 01/10/2018  . Situational stress 01/10/2018  . COPD (chronic obstructive pulmonary disease) ([place])   . Chronic kidney disease (CKD), stage III (moderate) (HCC)   . Arthritis   . Allergic rhinitis   . Pedal edema 12/31/2013  . Hyperlipidemia 12/31/2013  . Hyperglycemia 06/15/2012  . Tobacco abuse 06/15/2012  . Chronic back pain 06/15/2012  Past Medical History:   Diagnosis Date  . Cataract   . COPD (chronic obstructive pulmonary disease) ([place])   . Glaucoma   . Hyperlipidemia   . Lung cancer ([place])   . Obesity   . Renal failure, unspecified 06/15/2012     Past Surgical History:  Procedure Laterality Date  . BACK SURGERY     has had a total of 5 back surgeries  . GLAUCOMA SURGERY Right 06/2020   Dr. Dorna Bloom  . TONSILLECTOMY      Social History   Socioeconomic History  . Marital status: Married    Spouse name: Not on file  . Number of children: 1  . Years of education: Not on file  . Highest education level: High school graduate  Occupational History  . Occupation: Educational psychologist: [employer]    Comment: on workers comp x 2.5 yrs  . Occupation: disability  Tobacco Use  . Smoking status: Current Every Day Smoker    Packs/day: 0.50    Years: 52.00    Pack years: 26.00    Types: Cigarettes  . Smokeless tobacco: Never Used  . Tobacco comment: 1PPD 12/22/2020  Vaping Use  . Vaping Use: Never used  Substance and Sexual Activity  . Alcohol use: No  . Drug use: No  . Sexual activity: Yes  Other Topics Concern  . Not on file  Social History Narrative  . Not on file   Social Determinants of Health   Financial Resource Strain: Low Risk   . Difficulty of Paying Living Expenses: Not hard at all  Food Insecurity: No Food Insecurity  . Worried About Charity fundraiser in the Last Year: Never true  . Ran Out of Food in the Last Year: Never true  Transportation Needs: No Transportation Needs  . Lack of Transportation (Medical): No  . Lack of Transportation (Non-Medical): No  Physical Activity: Inactive  . Days of Exercise per Week: 0 days  . Minutes of Exercise per Session: 0 min  Stress: Stress Concern Present  . Feeling of Stress : To some extent  Social Connections: Socially Isolated  . Frequency of Communication with Friends and Family: Twice a week  . Frequency of Social Gatherings with Friends  and Family: More than three times a week  . Attends Religious Services: Never  . Active Member of Clubs or Organizations: No  . Attends Archivist Meetings: Never  . Marital Status: Divorced  Human resources officer Violence: Not At Risk  . Fear of Current or Ex-Partner: No  . Emotionally Abused: No  . Physically Abused: No  . Sexually Abused: No     Family History  Problem Relation Age of Onset  . Diverticulitis Mother   . Cancer Mother 30       pancreas  . Osteoarthritis Father   . Leukemia Daughter      Current Outpatient Medications:  .  albuterol (PROVENTIL HFA;VENTOLIN HFA) 108 (90 Base) MCG/ACT inhaler, Inhale 2 puffs into the lungs every 6 (six) hours as needed for wheezing or shortness of breath., Disp: 1 Inhaler, Rfl: 2 .  brimonidine-timolol (COMBIGAN) 0.2-0.5 % ophthalmic solution, Place 1 drop into both eyes 2 (two) times daily., Disp: , Rfl:  .  budesonide-formoterol (SYMBICORT) 160-4.5 MCG/ACT inhaler, Inhale 2 puffs into the lungs 2 (two) times daily., Disp: 30.6 g, Rfl: 1 .  sertraline (ZOLOFT) 100 MG tablet, Take 1-2 tablets (100-200 mg total) by mouth daily., Disp: 60 tablet, Rfl:  2 .  trazodone (DESYREL) 300 MG tablet, TAKE 1 TO 1 AND 1/2 TABLETS(300 TO 450 MG) BY MOUTH AT BEDTIME AS NEEDED FOR SLEEP, Disp: 100 tablet, Rfl: 2   Physical exam:  Vitals:   02/28/21 1105  BP: 118/80  Pulse: 66  Resp: 16  Temp: 98.1 F (36.7 C)  TempSrc: Oral  SpO2: 99%  Weight: 199 lb (90.3 kg)  Height: 6' (1.829 m)   Physical Exam Constitutional:      General: He is not in acute distress. Cardiovascular:     Rate and Rhythm: Normal rate and regular rhythm.     Heart sounds: Normal heart sounds.  Pulmonary:     Effort: Pulmonary effort is normal.     Breath sounds: Normal breath sounds.  Abdominal:     General: Bowel sounds are normal.     Palpations: Abdomen is soft.  Skin:    General: Skin is warm and dry.  Neurological:     Mental Status: He is alert and  oriented to person, place, and time.        CMP Latest Ref Rng & Units 09/28/2020  Glucose 65 - 99 mg/dL 78  BUN 8 - 27 mg/dL 28(H)  Creatinine 0.76 - 1.27 mg/dL 1.89(H)  Sodium 134 - 144 mmol/L 141  Potassium 3.5 - 5.2 mmol/L 4.4  Chloride 96 - 106 mmol/L 107(H)  CO2 20 - 29 mmol/L 20  Calcium 8.6 - 10.2 mg/dL 9.5  Total Protein 6.0 - 8.5 g/dL -  Total Bilirubin 0.0 - 1.2 mg/dL -  Alkaline Phos 39 - 117 IU/L -  AST 0 - 40 IU/L -  ALT 0 - 44 IU/L -   CBC Latest Ref Rng & Units 02/21/2021  WBC 4.0 - 10.5 K/uL 6.6  Hemoglobin 13.0 - 17.0 g/dL 13.6  Hematocrit 39.0 - 52.0 % 41.6  Platelets 150 - 400 K/uL 151    No images are attached to the encounter.  US BIOPSY (LIVER)  Result Date: 02/21/2021 INDICATION: Left perihilar lung mass and multiple liver lesions. The patient presents for biopsy of one of the liver lesions for a tissue diagnosis. EXAM: ULTRASOUND GUIDED CORE BIOPSY OF LIVER MEDICATIONS: None. ANESTHESIA/SEDATION: Fentanyl 100 mcg IV; Versed 2.0 mg IV Moderate Sedation Time:  17 minutes. The patient was continuously monitored during the procedure by the interventional radiology nurse under my direct supervision. PROCEDURE: The procedure, risks, benefits, and alternatives were explained to the patient. Questions regarding the procedure were encouraged and answered. The patient understands and consents to the procedure. A time-out was performed prior to initiating the procedure. Ultrasound was performed to localize lesions in the liver. The right lateral abdominal wall was prepped with chlorhexidine in a sterile fashion, and a sterile drape was applied covering the operative field. A sterile gown and sterile gloves were used for the procedure. Local anesthesia was provided with 1% Lidocaine. Under ultrasound guidance, a 17 gauge trocar needle was advanced to the margin of a lesion within the lateral right lobe of the liver. After confirming needle tip position, 3 separate coaxial  18 gauge core biopsy samples were obtained. Gel-Foam pledgets were advanced through the outer needle as the needle was removed and retracted. Additional ultrasound was performed. COMPLICATIONS: None immediate. FINDINGS: The largest discrete liver lesion identified is an oval shaped, hypoechoic subcapsular mass within the lateral right lobe measuring approximately 3.1 x 2.3 x 2.9 cm. Additional 1.6 cm lesion is seen in the more superior right lobe. Solid tissue was  obtained from the 3 cm subcapsular lesion. IMPRESSION: Ultrasound-guided core biopsy performed of the largest visible liver lesion by ultrasound which is a 3.1 cm subcapsular mass within the lateral right lobe. Electronically Signed   By: Aletta Edouard M.D.   On: 02/21/2021 11:56    Assessment and plan- Patient is a 65 y.o. 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  Also discussed the results of the CT chest.  CT chest initially showed Right pleural effusion and could not exclude underlying mass for which a PET CT scan was done.  PET CT scan however did not detect any right lung mass.  He was found to have a left suprahilar nodularity measuring 2 cm with an SUV of 8.7 with hypermetabolic tissue extending to the left upper lobe measuring SUV of 9.9.  Round atelectasis in the right lung base with minimal metabolic activity.  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  He would need brain MRI to complete staging work-up.  I recommend 4 cycles of combination palliative chemo immunotherapy with carbo etoposide Tecentriq given IV every 3 weeks for 4 cycles followed by maintenance  Tecentriq until progression or toxicity.  Discussed risks and benefits of carbo etoposide chemotherapy including all but not limited to nausea, vomiting, low blood counts, risk of infections and hospitalizations.  Discussed risks and benefits of immunotherapy including all but not limited to autoimmune side effect such as autoimmune colitis thyroiditis, pneumonitis and need to monitor kidney and liver functions.  Treatment will be given with a palliative intent.  Without any systemic therapy overall prognosis and stage IV small cell lung cancer is less than 6 months.  With treatment we could be looking at 1.5 to 2 years.  Discussed that chemotherapy will be palliative and not curative however.  Patient would like to think about his options and talk to his family and his daughter before making any further decisions.  We will reach out to him in 2 days and decide about further management plans after that.  Also discussed best supportive care/hospice should patient decide not to choose any chemotherapy.  Patient understands both his options and will get back to [place] in 2 days  Cancer Staging Small cell lung cancer [facility]) Staging form: Lung, AJCC 8th Edition - Clinical stage from 02/28/2021: Stage IVA (cT1b, cN1, cM1b) - Signed by Sindy Guadeloupe, MD on 02/28/2021 Histologic grade (G): G3 Histologic grading system: 4 grade system     Thank you for this kind referral and the opportunity to participate in the care of this patient   Visit Diagnosis 1. Goals of care, counseling/discussion   2. Small cell lung cancer (HCC)     Dr. Randa Evens, MD, MPH [facility] at [facility] [phone] 02/28/2021 4:45 PM

## 2021-03-03 ENCOUNTER — Encounter: Payer: Self-pay | Admitting: *Deleted

## 2021-03-03 ENCOUNTER — Other Ambulatory Visit: Payer: Medicare HMO

## 2021-03-03 NOTE — Progress Notes (Signed)
  Oncology Nurse Navigator Documentation  Navigator Location: [facility] (03/03/21 0900)   )Navigator Encounter Type: Telephone (03/03/21 0900) Telephone: Outgoing Call;Patient Update (03/03/21 0900)                           Interventions: Coordination of Care (03/03/21 0900)   Coordination of Care: Appts (03/03/21 0900)           follow up phone call made to patient to check on the status of his decision regarding treatment. Pt did not answer. Message left for patient to call back.        Time Spent with Patient: 30 (03/03/21 0900)

## 2021-03-03 NOTE — Progress Notes (Addendum)
Tumor Board Documentation  Andrew Estes was presented by Dr Janese Banks at our Tumor Board on 03/03/2021, which included representatives from medical oncology,radiation oncology,internal medicine,navigation,pathology,radiology,surgical,genetics,research,palliative care,pulmonology.  Andrew Estes currently presents as a new patient,for MDC,for new positive pathology with history of the following treatments:  (Biopsy).  Additionally, we reviewed previous medical and familial history, history of present illness, and recent lab results along with all available histopathologic and imaging studies. The tumor board considered available treatment options and made the following recommendations: Additional screening,Immunotherapy,Chemotherapy (MRI Braiin) Patient not sure he wants treatment  The following procedures/referrals were also placed: No orders of the defined types were placed in this encounter.   Clinical Trial Status: not discussed   Staging used: AJCC Stage Group  AJCC Staging: T: 1b N: 1 M: 1 Group: Extensive Stage Small Cell Lung Cancer   National site-specific guidelines NCCN were discussed with respect to the case.  Tumor board is a meeting of clinicians from various specialty areas who evaluate and discuss patients for whom a multidisciplinary approach is being considered. Final determinations in the plan of care are those of the provider(s). The responsibility for follow up of recommendations given during tumor board is that of the provider.   Today's extended care, comprehensive team conference, Andrew Estes was not present for the discussion and was not examined.   Multidisciplinary Tumor Board is a multidisciplinary case peer review process.  Decisions discussed in the Multidisciplinary Tumor Board reflect the opinions of the specialists present at the conference without having examined the patient.  Ultimately, treatment and diagnostic decisions rest with the primary provider(s) and the  patient.

## 2021-03-04 ENCOUNTER — Encounter: Payer: Self-pay | Admitting: *Deleted

## 2021-03-04 NOTE — Progress Notes (Signed)
  Oncology Nurse Navigator Documentation  Navigator Location: [facility] (03/04/21 1000)   )Navigator Encounter Type: Telephone (03/04/21 1000) Telephone: Lahoma Crocker Call (03/04/21 1000)                       Barriers/Navigation Needs: Coordination of Care (03/04/21 1000)   Interventions: Coordination of Care;Referrals (03/04/21 1000) Referrals: Palliative Care (03/04/21 1000) Coordination of Care: Hospice/Palliative Care (03/04/21 1000)       phone call made to patient to discuss if has made any decision regarding treatment. Pt states that at this time he would like to decline chemotherapy treatment. Pt still would like to proceed with brain MRI and requests a call back on Monday to review those results and discuss next steps regarding palliative care/hospice. Nothing further needed at this time. Pt instructed to call with any questions or needs. Pt verbalized understanding.           Time Spent with Patient: 30 (03/04/21 1000)

## 2021-03-05 ENCOUNTER — Other Ambulatory Visit: Payer: Self-pay

## 2021-03-05 ENCOUNTER — Ambulatory Visit
Admission: RE | Admit: 2021-03-05 | Discharge: 2021-03-05 | Disposition: A | Discharge status: Home / Self Care | Payer: Medicare HMO | Source: Ambulatory Visit | Attending: Oncology | Admitting: Oncology

## 2021-03-05 DIAGNOSIS — C3490 Malignant neoplasm of unspecified part of unspecified bronchus or lung: Secondary | ICD-10-CM

## 2021-03-05 MED ORDER — GADOBUTROL 1 MMOL/ML IV SOLN
9.0000 mL | Freq: Once | INTRAVENOUS | Status: AC | PRN
  Administered 2021-03-05: 9 mL via INTRAVENOUS

## 2021-03-07 ENCOUNTER — Telehealth: Payer: Self-pay | Admitting: *Deleted

## 2021-03-07 NOTE — Telephone Encounter (Signed)
Attempted to contact pt twice today without any answer and unable to leave message due to mailbox being full. Will try again at a later date to review brain MRI results and schedule follow up visit with Josh Borders to est care with palliative care services since pt has declined treatment and wants to pursue quality of life at this time.

## 2021-03-08 ENCOUNTER — Encounter: Payer: Self-pay | Admitting: *Deleted

## 2021-03-08 NOTE — Telephone Encounter (Signed)
Message left with patient to return to review MRI results and schedule appt for follow up with Josh. Awaiting call back.

## 2021-03-08 NOTE — Progress Notes (Signed)
  Oncology Nurse Navigator Documentation  Navigator Location: [facility] (03/08/21 1500)   )Navigator Encounter Type: Telephone (03/08/21 1500) Telephone: Incoming Call;Appt Confirmation/Clarification (03/08/21 1500)                       Barriers/Navigation Needs: Coordination of Care (03/08/21 1500)   Interventions: Coordination of Care;Referrals (03/08/21 1500) Referrals: Palliative Care (03/08/21 1500) Coordination of Care: Hospice/Palliative Care (03/08/21 1500)         reviewed results with patient regarding brain MRI. Informed that Dr. Janese Banks would like for him to meet with Josh in palliative care to coordinate further needs with hospice/palliative care since pt has declined treatment at this time. appt scheduled for pt to see Josh on 4/12 at 2pm. Instructed pt to call with any further questions or needs. Pt verbalized understanding. Nothing further needed at this time.          Time Spent with Patient: 30 (03/08/21 1500)

## 2021-03-15 ENCOUNTER — Inpatient Hospital Stay: Payer: Medicare HMO | Attending: Hospice and Palliative Medicine | Admitting: Hospice and Palliative Medicine

## 2021-03-15 VITALS — BP 113/76 | HR 69 | Temp 99.3°F | Resp 16 | Ht 72.0 in | Wt 206.0 lb

## 2021-03-15 DIAGNOSIS — C787 Secondary malignant neoplasm of liver and intrahepatic bile duct: Secondary | ICD-10-CM | POA: Insufficient documentation

## 2021-03-15 DIAGNOSIS — Z66 Do not resuscitate: Secondary | ICD-10-CM | POA: Insufficient documentation

## 2021-03-15 DIAGNOSIS — F17210 Nicotine dependence, cigarettes, uncomplicated: Secondary | ICD-10-CM | POA: Insufficient documentation

## 2021-03-15 DIAGNOSIS — C3490 Malignant neoplasm of unspecified part of unspecified bronchus or lung: Secondary | ICD-10-CM | POA: Insufficient documentation

## 2021-03-15 DIAGNOSIS — J449 Chronic obstructive pulmonary disease, unspecified: Secondary | ICD-10-CM | POA: Diagnosis not present

## 2021-03-15 DIAGNOSIS — Z7189 Other specified counseling: Secondary | ICD-10-CM | POA: Diagnosis not present

## 2021-03-15 DIAGNOSIS — N183 Chronic kidney disease, stage 3 unspecified: Secondary | ICD-10-CM | POA: Insufficient documentation

## 2021-03-15 DIAGNOSIS — Z515 Encounter for palliative care: Secondary | ICD-10-CM | POA: Diagnosis not present

## 2021-03-15 MED ORDER — HYDROMORPHONE HCL 2 MG PO TABS: 1.0000 mg | ORAL_TABLET | ORAL | 0 refills | Status: DC | PRN

## 2021-03-15 MED ORDER — PROCHLORPERAZINE MALEATE 10 MG PO TABS: 10.0000 mg | ORAL_TABLET | Freq: Four times a day (QID) | ORAL | 0 refills | Status: DC | PRN

## 2021-03-15 MED ORDER — ONDANSETRON HCL 4 MG PO TABS: 4.0000 mg | ORAL_TABLET | Freq: Three times a day (TID) | ORAL | 0 refills | Status: AC | PRN

## 2021-03-15 NOTE — Progress Notes (Signed)
Pt not eating and drinking much. He does not feel hungry but puts food in his mouth. He still has sore on his finger from where he got fingerstick in order to have scan.

## 2021-03-15 NOTE — Progress Notes (Signed)
[place]  [phone] [phone]   Name: Andrew Estes Date: 03/15/2021 MRN: [id number]  DOB: 12-24-1955  Patient Care Team: [name], MD as PCP - General (Family Medicine) Eulogio Bear, MD as Consulting Physician (Ophthalmology) Klifto, Lindajo Royal, MD as Referring Physician (Glaucoma Ophthalmology) Flora Lipps, MD as Consulting Physician (Pulmonary Disease) Telford Nab, RN as Oncology Nurse Navigator    REASON FOR CONSULTATION: Andrew Estes is a 66 y.o. 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  Patient was seen in consultation on medical oncology with recommendation for palliative chemo/immunotherapy.  Patient ultimately declined treatment.  He has been symptomatic with fatigue.  He was referred to palliative care to help address goals and manage ongoing symptoms.  SOCIAL HISTORY:     reports that he has been smoking cigarettes. He has a 26.00 pack-year smoking history. He has never used smokeless tobacco. He reports that he does not drink alcohol and does not use drugs.  Patient is married but lives in a separate house from his wife.  He lives at home with his 3 year old father.  He has a 44 year old daughter who also lives nearby.  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  He is currently on disability.  ADVANCE DIRECTIVES:  None  CODE STATUS: DNR/DNI (DNR form signed 03/15/21)  PAST MEDICAL HISTORY: Past Medical History:  Diagnosis Date  . Cataract   . COPD (chronic obstructive pulmonary disease) ([place])   . Glaucoma   . Hyperlipidemia   . Lung cancer ([place])   . Obesity   . Renal failure, unspecified 06/15/2012    PAST SURGICAL HISTORY:  Past Surgical History:  Procedure Laterality Date  . BACK SURGERY     has had a total  of 5 back surgeries  . GLAUCOMA SURGERY Right 06/2020   Dr. Dorna Bloom  . TONSILLECTOMY      HEMATOLOGY/ONCOLOGY HISTORY:  Oncology History  Small cell lung cancer ([place])  02/28/2021 Initial Diagnosis   Small cell lung cancer ([place])   02/28/2021 Cancer Staging   Staging form: Lung, AJCC 8th Edition - Clinical stage from 02/28/2021: Stage IVA (cT1b, cN1, cM1b) - Signed by Sindy Guadeloupe, MD on 02/28/2021 Histologic grade (G): G3 Histologic grading system: 4 grade system     ALLERGIES:  is allergic to morphine and related, oxycontin [oxycodone hcl], tylenol with codeine #3 [acetaminophen-codeine], and levofloxacin.  MEDICATIONS:  Current Outpatient Medications  Medication Sig Dispense Refill  . albuterol (PROVENTIL HFA;VENTOLIN HFA) 108 (90 Base) MCG/ACT inhaler Inhale 2 puffs into the lungs every 6 (six) hours as needed for wheezing or shortness of breath. 1 Inhaler 2  . brimonidine-timolol (COMBIGAN) 0.2-0.5 % ophthalmic solution Place 1 drop into both eyes 2 (two) times daily.    . budesonide-formoterol (SYMBICORT) 160-4.5 MCG/ACT inhaler Inhale 2 puffs into the lungs 2 (two) times daily. 30.6 g 1  . sertraline (ZOLOFT) 100 MG tablet Take 1-2 tablets (100-200 mg total) by mouth daily. 60 tablet 2  . trazodone (DESYREL) 300 MG tablet TAKE 1 TO 1 AND 1/2 TABLETS(300 TO 450 MG) BY MOUTH AT BEDTIME AS NEEDED FOR SLEEP 100 tablet 2   No current facility-administered medications for this visit.    VITAL SIGNS: There were no vitals taken for this visit. There were no vitals filed for this visit.  Estimated body mass index is 26.99 kg/m as calculated from the following:   Height as of 02/28/21: 6' (1.829 m).   Weight as of 02/28/21: 199 lb (90.3 kg).  LABS: CBC:    Component Value Date/Time   WBC 6.6 02/21/2021 0931   HGB 13.6 02/21/2021 0931   HGB 13.7 01/13/2020 1359   HCT 41.6 02/21/2021 0931   HCT 41.5 01/13/2020 1359   PLT 151 02/21/2021 0931   PLT 173 01/13/2020 1359   MCV  98.6 02/21/2021 0931   MCV 97 01/13/2020 1359   NEUTROABS 7.1 11/24/2014 1937   LYMPHSABS 3.2 11/24/2014 1937   MONOABS 0.9 11/24/2014 1937   EOSABS 0.6 11/24/2014 1937   BASOSABS 0.1 11/24/2014 1937   Comprehensive Metabolic Panel:    Component Value Date/Time   NA 141 09/28/2020 1520   K 4.4 09/28/2020 1520   CL 107 (H) 09/28/2020 1520   CO2 20 09/28/2020 1520   BUN 28 (H) 09/28/2020 1520   CREATININE 1.89 (H) 09/28/2020 1520   GLUCOSE 78 09/28/2020 1520   GLUCOSE 90 11/24/2014 1937   CALCIUM 9.5 09/28/2020 1520   AST 11 01/13/2020 1359   ALT 7 01/13/2020 1359   ALKPHOS 99 01/13/2020 1359   BILITOT 0.4 01/13/2020 1359   PROT 7.2 01/13/2020 1359   ALBUMIN 4.5 09/28/2020 1520    RADIOGRAPHIC STUDIES: MR Brain W Wo Contrast  Result Date: 03/05/2021 CLINICAL DATA:  Small cell lung cancer.  Staging. EXAM: MRI HEAD WITHOUT AND WITH CONTRAST TECHNIQUE: Multiplanar, multiecho pulse sequences of the brain and surrounding structures were obtained without and with intravenous contrast. CONTRAST:  46m GADAVIST GADOBUTROL 1 MMOL/ML IV SOLN COMPARISON:  None. FINDINGS: Brain: No acute infarct, hemorrhage, or mass lesion is present. Periventricular and subcortical T2 hyperintensities are mildly advanced for age. No enhancing lesions are present. Ventricles are of normal size. Insert pass fluid The internal auditory canals are within normal limits. The brainstem and cerebellum are within normal limits. Postcontrast images demonstrate no pathologic enhancement. Vascular: Flow is present in the major intracranial arteries. Skull and upper cervical spine: The craniocervical junction is normal. Upper cervical spine is within normal limits. Marrow signal is unremarkable. Sinuses/Orbits: The paranasal sinuses and mastoid air cells are clear. Right lens replacement noted. Globes and orbits are otherwise within normal limits. IMPRESSION: 1. No evidence for metastatic disease to the brain or meninges. 2.  Periventricular and subcortical T2 hyperintensities are mildly advanced for age. The finding is nonspecific but can be seen in the setting of chronic microvascular ischemia, a demyelinating process such as multiple sclerosis, vasculitis, complicated migraine headaches, or as the sequelae of a prior infectious or inflammatory process. Electronically Signed   By: CSan MorelleM.D.   On: 03/05/2021 15:52   [place]BIOPSY (LIVER)  Result Date: 02/21/2021 INDICATION: Left perihilar lung mass and multiple liver lesions. The patient presents for biopsy of one of the liver lesions for a tissue diagnosis. EXAM: ULTRASOUND GUIDED CORE BIOPSY OF LIVER MEDICATIONS: None. ANESTHESIA/SEDATION: Fentanyl 100 mcg IV; Versed 2.0 mg IV Moderate Sedation Time:  17 minutes. The patient was continuously monitored during the procedure by the interventional radiology nurse under my direct supervision. PROCEDURE: The procedure, risks, benefits, and alternatives were explained to the patient. Questions regarding the procedure were encouraged and answered. The patient understands and consents to the procedure. A time-out was performed prior to initiating the procedure. Ultrasound was performed to localize lesions in the liver. The right lateral abdominal wall was prepped with  chlorhexidine in a sterile fashion, and a sterile drape was applied covering the operative field. A sterile gown and sterile gloves were used for the procedure. Local anesthesia was provided with 1% Lidocaine. Under ultrasound guidance, a 17 gauge trocar needle was advanced to the margin of a lesion within the lateral right lobe of the liver. After confirming needle tip position, 3 separate coaxial 18 gauge core biopsy samples were obtained. Gel-Foam pledgets were advanced through the outer needle as the needle was removed and retracted. Additional ultrasound was performed. COMPLICATIONS: None immediate. FINDINGS: The largest discrete liver lesion identified is an  oval shaped, hypoechoic subcapsular mass within the lateral right lobe measuring approximately 3.1 x 2.3 x 2.9 cm. Additional 1.6 cm lesion is seen in the more superior right lobe. Solid tissue was obtained from the 3 cm subcapsular lesion. IMPRESSION: Ultrasound-guided core biopsy performed of the largest visible liver lesion by ultrasound which is a 3.1 cm subcapsular mass within the lateral right lobe. Electronically Signed   By: Aletta Edouard M.D.   On: 02/21/2021 11:56    PERFORMANCE STATUS (ECOG) : 1 - Symptomatic but completely ambulatory  Review of Systems Unless otherwise noted, a complete review of systems is negative.  Physical Exam General: NAD Pulmonary: Unlabored Extremities: no edema, no joint deformities Skin: no rashes Neurological: Grossly nonfocal  IMPRESSION: I met with patient and wife today in the clinic.  Patient tells me that he has experienced cancer treatment with his mother and daughter.  Ultimately, he recognizes that this cancer is extensive stage and his long-term prognosis is poor.  He verbalized feeling that although treatment could potentially prolong his life, he fears it would do so at the cost of quality of life.  He says that he would prefer to forego treatment and focus on quality of life and allow his death to occur naturally.  We discussed hospice involvement and patient was in agreement with this.  It is his goal to die at home.  He is familiar with hospice from the death of his mother.  We will send a hospice referral.  Symptomatically, he has occasional upper abdominal pain, described as being sharp and intense in nature.  He is currently not taking anything for pain.  He has a lengthy history of allergies and adverse events from pain medications.  He says he has tolerated hydromorphone the best in the past.  We will start him on hydromorphone tablets.  We will also send him an Rx for antiemetics to have on hand if needed.  We discussed  constipation management in detail today.  Oral intake is poor.  I suggested that he start oral nutritional supplements daily.  He describes some financial strain due to the cost of cancer work-up.  He is on a fixed income due to disability.  We will send a referral to [facility].  We discussed CODE STATUS.  Patient stated clearly that he would not want to be resuscitated nor have his life prolonged artificially machines.  His wife is in agreement with this decision.  I completed a DNR form for him to take home today.  PLAN: -Referral for hospice at home -Hydromorphone 1-2 mg p.o. every 4 hours as needed for pain (#45) PDMP reviewed -Daily bowel regimen with senna/MiraLAX -Oral nutritional supplements -Ondansetron 4 mg every 8 hours as needed for nausea -Compazine 10 mg every 6 hours as needed for nausea -DNR/DNI -RTC as needed  Case and plan discussed with Dr. Janese Banks   Patient  expressed understanding and was in agreement with this plan. He also understands that He can call the clinic at any time with any questions, concerns, or complaints.     Time Total: 60 minutes  Visit consisted of counseling and education dealing with the complex and emotionally intense issues of symptom management and palliative care in the setting of serious and potentially life-threatening illness.Greater than 50%  of this time was spent counseling and coordinating care related to the above assessment and plan.  Signed by: Altha Harm, PhD, NP-C

## 2021-03-21 ENCOUNTER — Other Ambulatory Visit: Payer: Self-pay | Admitting: *Deleted

## 2021-03-21 MED ORDER — PROCHLORPERAZINE MALEATE 10 MG PO TABS: 10.0000 mg | ORAL_TABLET | Freq: Four times a day (QID) | ORAL | 2 refills | Status: AC | PRN

## 2021-03-25 NOTE — Progress Notes (Signed)
Patient informed PSN that he was in the middle of something and would call back Monday, 03/28/21.

## 2021-03-31 ENCOUNTER — Ambulatory Visit: Payer: Medicare HMO | Admitting: Internal Medicine

## 2021-04-01 ENCOUNTER — Ambulatory Visit: Payer: Self-pay | Admitting: Family Medicine

## 2021-04-01 NOTE — Progress Notes (Deleted)
Established patient visit   Patient: Andrew Estes   DOB: 30-Jun-1956   65 y.o. Male  MRN: [id number] Visit Date: 04/01/2021  Today's healthcare provider: Lelon Huh, MD   No chief complaint on file.  Subjective    HPI  Follow up for Stage 3b Chronic Kidney type:  The patient was last seen for this 6 months ago. Changes made at last visit include no changes.  He reports {excellent/good/fair/poor:19665} compliance with treatment. He feels that condition is {improved/worse/unchanged:3041574}. He {is/is not:21021397} having side effects. ***  -----------------------------------------------------------------------------------------  COPD, Follow up  He was last seen for this 6 months ago. Changes made include no changes.   He reports {excellent/good/fair/poor:19665} compliance with treatment. He {is/is not:21021397} having side effects. *** he uses rescue inhaler {NUMBERS 1-12:18279} per {days/wks/mos/yrs:310907}. He IS experiencing {Symptoms; copd:17792}. He is NOT experiencing {Symptoms; copd:17792:o}. he reports breathing is {improved/worse/unchanged:3041574}.  Pulmonary Functions Testing Results:  No results found for: FEV1, FVC, FEV1FVC, TLC  -----------------------------------------------------------------------------------------  Follow up for situational stress:  The patient was last seen for this 6 months ago. Changes made at last visit include none; continue same dosage of Sertraline.  He reports {excellent/good/fair/poor:19665} compliance with treatment. He feels that condition is {improved/worse/unchanged:3041574}. He {is/is not:21021397} having side effects. ***  -----------------------------------------------------------------------------------------  Follow up for insomnia:  The patient was last seen for this 6 months ago. Changes made at last visit include none; continue as needed Trazodone.  He reports {excellent/good/fair/poor:19665}  compliance with treatment. He feels that condition is {improved/worse/unchanged:3041574}. He {is/is not:21021397} having side effects. ***  -----------------------------------------------------------------------------------------  Vitamin D deficiency, follow-up  Lab Results  Component Value Date   VD25OH 20.4 (L) 09/28/2020   CALCIUM 9.5 09/28/2020   CALCIUM 9.5 01/13/2020  ) Wt Readings from Last 3 Encounters:  03/15/21 206 lb (93.4 kg)  02/28/21 199 lb (90.3 kg)  02/21/21 200 lb (90.7 kg)    He was last seen for vitamin D deficiency 6 months ago.  Management since that visit includes recommending that he start taking OTC vitamin D3 1000 units once a day due to low vitamin D level. He reports {excellent/good/fair/poor:19665} compliance with treatment. He {is/is not:21021397} having side effects. {document side effects if present:1}  Symptoms: {Yes/No:20286} change in energy level {Yes/No:20286} numbness or tingling  {Yes/No:20286} bone pain {Yes/No:20286} unexplained fracture   ---------------------------------------------------------------------------------------------------    {Show patient history (optional):23778::" "}   Medications: Outpatient Medications Prior to Visit  Medication Sig  . albuterol (PROVENTIL HFA;VENTOLIN HFA) 108 (90 Base) MCG/ACT inhaler Inhale 2 puffs into the lungs every 6 (six) hours as needed for wheezing or shortness of breath.  . brimonidine-timolol (COMBIGAN) 0.2-0.5 % ophthalmic solution Place 1 drop into both eyes 2 (two) times daily.  . budesonide-formoterol (SYMBICORT) 160-4.5 MCG/ACT inhaler Inhale 2 puffs into the lungs 2 (two) times daily.  Marland Kitchen HYDROmorphone (DILAUDID) 2 MG tablet Take 0.5-1 tablets (1-2 mg total) by mouth every 4 (four) hours as needed for severe pain.  Marland Kitchen ondansetron (ZOFRAN) 4 MG tablet Take 1 tablet (4 mg total) by mouth every 8 (eight) hours as needed for nausea or vomiting.  . prochlorperazine (COMPAZINE) 10 MG  tablet Take 1 tablet (10 mg total) by mouth every 6 (six) hours as needed for nausea or vomiting.  . sertraline (ZOLOFT) 100 MG tablet Take 1-2 tablets (100-200 mg total) by mouth daily.  . trazodone (DESYREL) 300 MG tablet TAKE 1 TO 1 AND 1/2 TABLETS(300 TO 450 MG) BY MOUTH AT BEDTIME AS NEEDED  FOR SLEEP   No facility-administered medications prior to visit.    Review of Systems  {Labs  Heme  Chem  Endocrine  Serology  Results Review (optional):23779::" "}    Objective    There were no vitals taken for this visit. {Show previous vital signs (optional):23777::" "}  Physical Exam  ***  No results found for any visits on 04/01/21.  Assessment & Plan     ***  No follow-ups on file.      {provider attestation***:1}   Lelon Huh, MD  [facility] [phone] (phone) [phone] (fax)  [place]

## 2021-04-12 ENCOUNTER — Other Ambulatory Visit: Payer: Self-pay | Admitting: Family Medicine

## 2021-04-12 DIAGNOSIS — F439 Reaction to severe stress, unspecified: Secondary | ICD-10-CM

## 2021-04-26 ENCOUNTER — Other Ambulatory Visit: Payer: Self-pay | Admitting: Hospice and Palliative Medicine

## 2021-04-26 MED ORDER — HYDROMORPHONE HCL 2 MG PO TABS: 1.0000 mg | ORAL_TABLET | ORAL | 0 refills | Status: DC | PRN

## 2021-04-26 NOTE — Progress Notes (Addendum)
Hospice requested refill of hydromorphone. Rx sent for #60  Reportedly, patient is also having increased bony pain in hips and shoulders.  Okay to trial dexamethasone 2 mg daily.

## 2021-05-09 ENCOUNTER — Telehealth: Payer: Self-pay | Admitting: Family Medicine

## 2021-05-09 NOTE — Telephone Encounter (Signed)
Patient is returning a call to reschedule his appt.   Please call patient to reschedule appt. CB# [phone]

## 2021-05-16 ENCOUNTER — Other Ambulatory Visit: Payer: Self-pay | Admitting: *Deleted

## 2021-05-16 MED ORDER — HYDROMORPHONE HCL 2 MG PO TABS: 1.0000 mg | ORAL_TABLET | ORAL | 0 refills | Status: DC | PRN

## 2021-05-20 ENCOUNTER — Other Ambulatory Visit: Payer: Self-pay | Admitting: Family Medicine

## 2021-05-20 DIAGNOSIS — G4700 Insomnia, unspecified: Secondary | ICD-10-CM

## 2021-05-20 NOTE — Telephone Encounter (Signed)
  Notes to clinic:  Patient has appointment on 05/16/2021 that was canceled  Review for refill    Requested Prescriptions  Pending Prescriptions Disp Refills   trazodone (DESYREL) 300 MG tablet [Pharmacy Med Name: TRAZODONE 300MG  TABLETS] 100 tablet 2    Sig: TAKE 1 TO 1 AND 1/2 TABLETS(300 TO 450 MG) BY MOUTH AT BEDTIME AS NEEDED FOR SLEEP      Psychiatry: Antidepressants - Serotonin Modulator Failed - 05/20/2021  3:07 PM      Failed - Valid encounter within last 6 months    Recent Outpatient Visits           7 months ago Stage 3b chronic kidney disease ([place])   [facility] [name], MD   1 year ago Stage 3b chronic kidney disease   [facility] [name], MD   1 year ago Insomnia, unspecified type   [facility] [name], MD   2 years ago Chronic kidney disease (CKD), stage III (moderate) [facility])   [facility] [name], MD   2 years ago Cough   [facility] [name], MD

## 2021-05-30 ENCOUNTER — Telehealth: Payer: Self-pay | Admitting: *Deleted

## 2021-05-30 MED ORDER — HYDROMORPHONE HCL 2 MG PO TABS: 1.0000 mg | ORAL_TABLET | ORAL | 0 refills | Status: DC | PRN

## 2021-05-30 NOTE — Telephone Encounter (Signed)
Per Mitzi his pain is all over especially with movement. He is not the best at taking the amount of medicine he can take due to taking care of his elderly father and bipolar wife. He is still driving. She states that Judson Roch his NCM has asked that he keep a pain  medicine journal and that are considering a Dilaudid pump or a pain patch in the near future. Shew said for now to please just refill his dilaudid as written until the medicine journaling is done

## 2021-05-30 NOTE — Telephone Encounter (Signed)
He is a hospice patient. Can you reach out to [facility] and see if long acting pain med would be warranted at this time? Where is his pain?

## 2021-05-30 NOTE — Telephone Encounter (Signed)
Mitzi called reporting that patient is out of his Dilaudid, he is using 2 tabs twice a day and 2-3 tabs at hs for pain control. She states the patient NCM is concerned at the amount of medicine patient is taking and is asking of anything else could be used, but Mitzi states looking at his allergies, she does not know if patient could go on anything long acting or not.

## 2021-06-07 ENCOUNTER — Other Ambulatory Visit: Payer: Self-pay | Admitting: Hospice and Palliative Medicine

## 2021-06-07 MED ORDER — FENTANYL 25 MCG/HR TD PT72: 1.0000 | MEDICATED_PATCH | TRANSDERMAL | 0 refills | Status: DC

## 2021-06-07 MED ORDER — HYDROMORPHONE HCL 2 MG PO TABS: 2.0000 mg | ORAL_TABLET | ORAL | 0 refills | Status: DC | PRN

## 2021-06-07 NOTE — Progress Notes (Signed)
Received a call from patient's hospice nurse, Judson Roch.  Patient continues to have persistent generalized pain.  When he has periods of severe pain, he is taking multiple oral hydromorphone to try to achieve comfort.  In past week, he has used approximately 56 tablets of hydromorphone.  Generally, he is taking 2-3 hydromorphone tablets 3 times a day.  Discussed options with hospice nurse.  We will start patient on transdermal fentanyl .  We will also liberalize hydromorphone for breakthrough pain if needed  Plan: -Start transdermal fentanyl 25 mcg to 72 hours, #5 -Refill hydromorphone 2 to 4 mg every 4 hours as needed for breakthrough pain, #60 -Daily bowel regimen

## 2021-06-17 ENCOUNTER — Other Ambulatory Visit: Payer: Self-pay | Admitting: *Deleted

## 2021-06-17 MED ORDER — FENTANYL 25 MCG/HR TD PT72: 1.0000 | MEDICATED_PATCH | TRANSDERMAL | 0 refills | Status: DC

## 2021-06-17 MED ORDER — HYDROMORPHONE HCL 2 MG PO TABS: 2.0000 mg | ORAL_TABLET | ORAL | 0 refills | Status: DC | PRN

## 2021-06-17 NOTE — Telephone Encounter (Signed)
Hospice requesting refill for Dilasudid and fenatanyl patch. Order pending for MD approval.

## 2021-06-21 ENCOUNTER — Other Ambulatory Visit: Payer: Self-pay | Admitting: Hospice and Palliative Medicine

## 2021-06-21 MED ORDER — FENTANYL 50 MCG/HR TD PT72: 1.0000 | MEDICATED_PATCH | TRANSDERMAL | 0 refills | Status: DC

## 2021-06-21 NOTE — Progress Notes (Signed)
Received a call from patient's hospice nurse, [place].  Patient has reportedly been having some GI upset from the hydromorphone tablets.  Hospice has increased his fentanyl to two 25 mcg patches to equal a 50 mcg dose.  Pain has been better controlled.  We will refill 50 mcg patches.

## 2021-06-27 ENCOUNTER — Telehealth: Payer: Self-pay | Admitting: *Deleted

## 2021-06-27 NOTE — Telephone Encounter (Signed)
Mitzi requests a return call to discuss patient bowel regimen

## 2021-06-30 ENCOUNTER — Other Ambulatory Visit: Payer: Self-pay | Admitting: Hospice and Palliative Medicine

## 2021-06-30 MED ORDER — HYDROMORPHONE HCL 2 MG PO TABS: 2.0000 mg | ORAL_TABLET | ORAL | 0 refills | Status: DC | PRN

## 2021-06-30 NOTE — Progress Notes (Signed)
Hospice requested refill of hydromorphone tablets. Rx sent. #60

## 2021-07-03 ENCOUNTER — Other Ambulatory Visit: Payer: Self-pay | Admitting: Oncology

## 2021-07-04 ENCOUNTER — Other Ambulatory Visit: Payer: Self-pay | Admitting: Oncology

## 2021-07-04 ENCOUNTER — Other Ambulatory Visit: Payer: Self-pay | Admitting: *Deleted

## 2021-07-04 MED ORDER — FENTANYL 50 MCG/HR TD PT72: 1.0000 | MEDICATED_PATCH | TRANSDERMAL | 0 refills | Status: DC

## 2021-07-04 NOTE — Telephone Encounter (Signed)
Call returned to [facility] and informed per Dr Janese Banks that she does not give muscle relaxer for hip pain and the patient can take occasional Motrin or Tylenol if the narcotics are not working.

## 2021-07-04 NOTE — Telephone Encounter (Signed)
Patient told hospice nurse that he has decided that his right hip pain is sciatica and is asking for a muscle relaxer. He also needs his Fentanyl refilled Please advise

## 2021-07-11 ENCOUNTER — Other Ambulatory Visit: Payer: Self-pay | Admitting: Hospice and Palliative Medicine

## 2021-07-11 MED ORDER — HYDROMORPHONE HCL 2 MG PO TABS: 2.0000 mg | ORAL_TABLET | ORAL | 0 refills | Status: DC | PRN

## 2021-07-11 NOTE — Progress Notes (Signed)
Received a call from patient's hospice nurse.  Patient is having radiculopathy and pain originating from his back with spasms that radiate down his leg.  He requested a muscle relaxer.  Okay to start Flexeril 5 mg every 8 hours as needed per hospice protocol.  We will also refill hydromorphone as requested.

## 2021-07-16 ENCOUNTER — Other Ambulatory Visit: Payer: Self-pay | Admitting: Oncology

## 2021-07-18 ENCOUNTER — Telehealth: Payer: Self-pay | Admitting: Hospice and Palliative Medicine

## 2021-07-18 MED ORDER — HYDROMORPHONE HCL 2 MG PO TABS: 2.0000 mg | ORAL_TABLET | ORAL | 0 refills | Status: DC | PRN

## 2021-07-18 MED ORDER — FENTANYL 75 MCG/HR TD PT72: 1.0000 | MEDICATED_PATCH | TRANSDERMAL | 0 refills | Status: DC

## 2021-07-18 NOTE — Telephone Encounter (Signed)
I received a call from patient's hospice nurse.  Patient has had worse generalized pain in joints, back, legs, arms over the past several weeks.  He has been taking hydromorphone 3 tablets (6 mg) 3 times a day, which was working well but is now no longer controlling the pain.  Its been over a month since the transdermal fentanyl dose was last increased to 50 mcg every 72 hours.  Discussed option of him taking the hydromorphone more frequently but nurse says that he often cannot remember.  We will plan to increase fentanyl to 75 mcg to 72 hours.  Additionally, he is also on dexamethasone.  Hospice nurse requested refills of both hydromorphone and fentanyl.

## 2021-07-29 ENCOUNTER — Other Ambulatory Visit: Payer: Self-pay | Admitting: Hospice and Palliative Medicine

## 2021-07-29 MED ORDER — HYDROMORPHONE HCL 2 MG PO TABS: 2.0000 mg | ORAL_TABLET | ORAL | 0 refills | Status: DC | PRN

## 2021-07-29 NOTE — Progress Notes (Signed)
Hospice requested refill of hydromorphone. Rx sent to pharmacy.

## 2021-08-03 ENCOUNTER — Other Ambulatory Visit: Payer: Self-pay | Admitting: Hospice and Palliative Medicine

## 2021-08-03 MED ORDER — HYDROMORPHONE HCL 2 MG PO TABS: 2.0000 mg | ORAL_TABLET | ORAL | 0 refills | Status: DC | PRN

## 2021-08-03 MED ORDER — FENTANYL 75 MCG/HR TD PT72: 1.0000 | MEDICATED_PATCH | TRANSDERMAL | 0 refills | Status: AC

## 2021-08-03 NOTE — Progress Notes (Signed)
Hospice requested refills of fentanyl and hydromorphone.  Rx sent.  I will discharge

## 2021-08-09 ENCOUNTER — Other Ambulatory Visit: Payer: Self-pay | Admitting: Hospice and Palliative Medicine

## 2021-08-09 MED ORDER — HYDROMORPHONE HCL 1 MG/ML PO LIQD: 4.0000 mg | ORAL | 0 refills | Status: AC | PRN

## 2021-08-09 NOTE — Progress Notes (Signed)
I spoke with patient's hospice nurse.  Patient's had more difficulty swallowing.  We reviewed his med list with options for discontinuing oral meds.  We will switch from hydromorphone tablets to elixir.  New Rx sent to pharmacy.  Patient is reportedly rapidly declining.  Goal remains comfort at home.

## 2021-08-12 ENCOUNTER — Telehealth: Payer: Self-pay | Admitting: *Deleted

## 2021-08-12 NOTE — Telephone Encounter (Signed)
Mitzi called reporting that patient is on need of medicine changes; he is taking alternating medicine of Ativan 1 mg and Haldol 5 mg every 2 hours and is "still wild as a buck" he is trying to get out of bed and only sleeps for 30 minutes at a time. Asking if Thorazine can be added. Please return her call

## 2021-08-24 IMAGING — CT NM PET TUM IMG INITIAL (PI) SKULL BASE T - THIGH
10 series · 24 of 25 positions shown · non-contrast
Comparison: CT 10/10/2020, 01/28/2018

CLINICAL DATA: Initial treatment strategy for pulmonary nodule.

EXAM:
NUCLEAR MEDICINE PET SKULL BASE TO THIGH
TECHNIQUE: 11.1 mCi F-18 FDG was injected intravenously. Full-ring PET imaging
was performed from the skull base to thigh after the radiotracer. CT
data was obtained and used for attenuation correction and anatomic
localization.
Fasting blood glucose: 70 mg/dl

[Series 3: ct wb 5.0 b30f · axial · 5.0mm · 0.98mm/px · z∈[-381,+603]mm · 3 of 329 slices shown]
[im 1/329  brain]
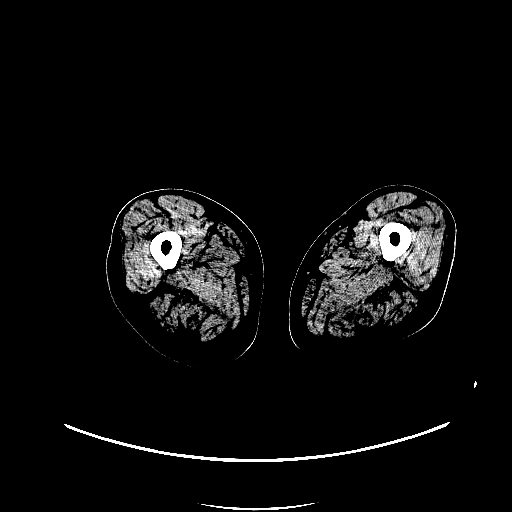
[im 165/329  brain]
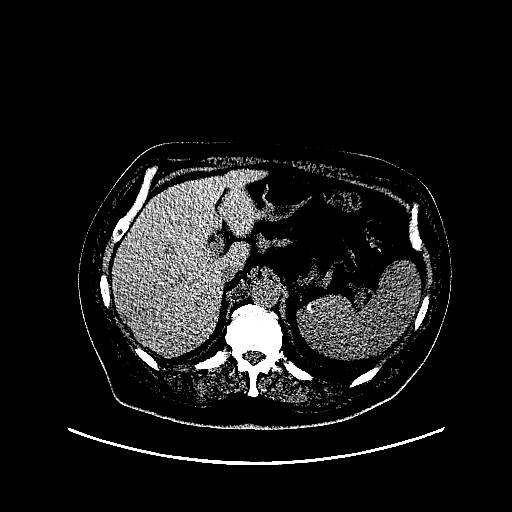
[im 329/329  brain]
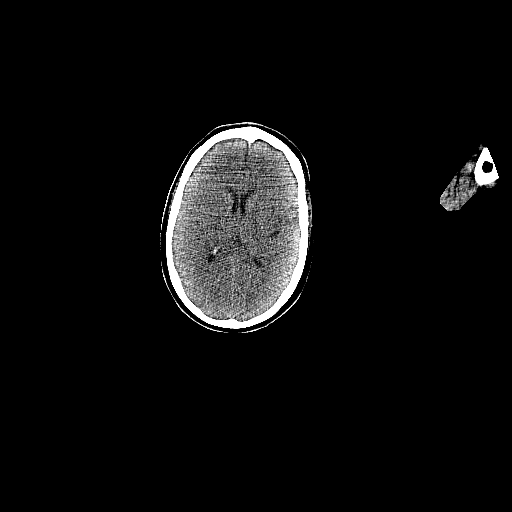

[Series 5: pet wb uncorrected (nac) · axial · 5.0mm · 4.07mm/px · z∈[-381,+603]mm · 3 of 329 slices shown]
[im 1/329]
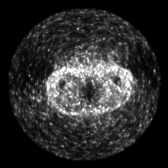
[im 165/329]
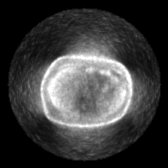
[im 329/329]
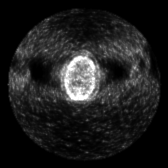

[Series 6: pet wb (ac) · axial · 5.0mm · 3.39mm/px · z∈[-381,+603]mm · 4 of 329 slices shown]
[im 1/329]
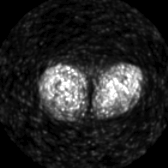
[im 110/329]
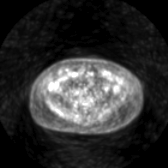
[im 219/329]
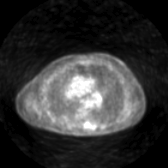
[im 329/329]
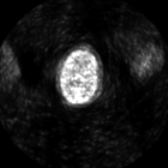

[Series 603: fused axial · 3 of 325 slices shown]
[im 1/325]
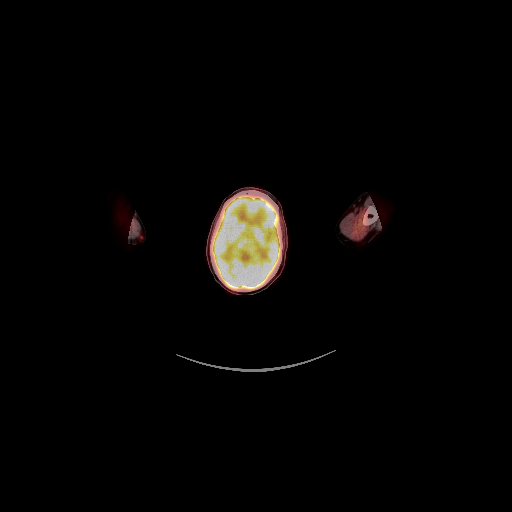
[im 109/325]
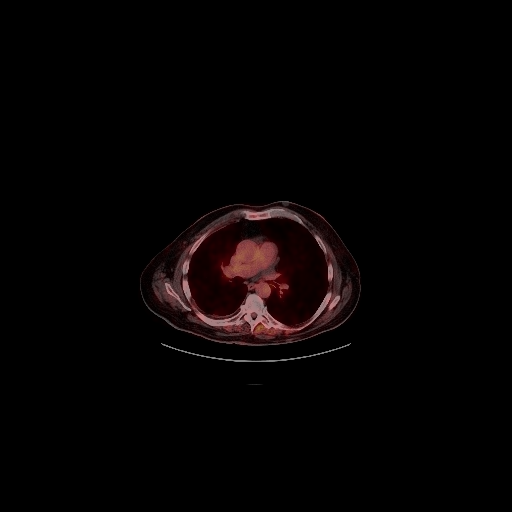
[im 325/325]
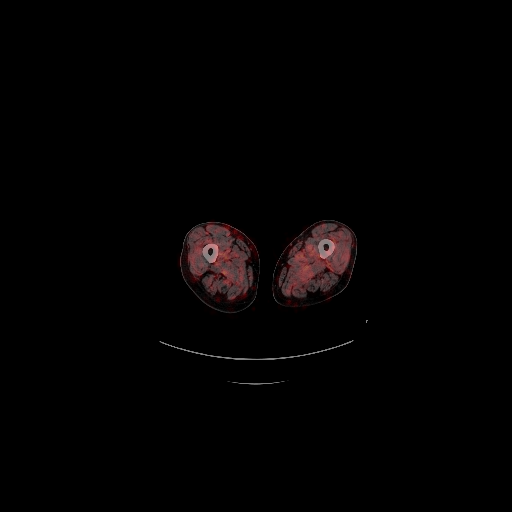

[Series 604: fused coronal · 1 of 62 slices shown]
[im 1/62]
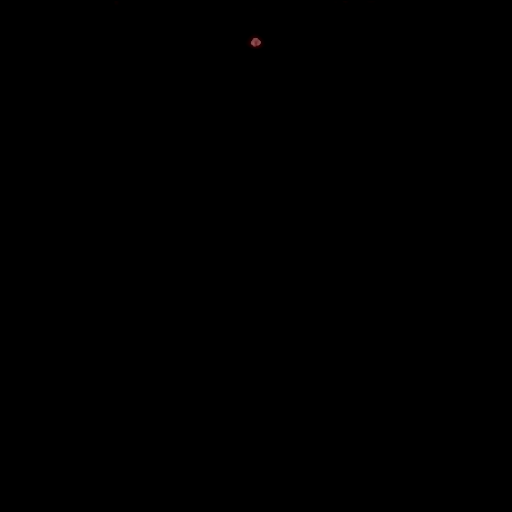

[Series 605: fused sagittal · 2 of 128 slices shown]
[im 1/128]
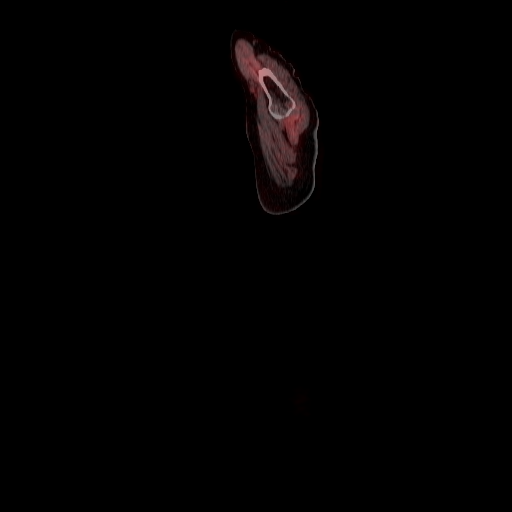
[im 128/128]
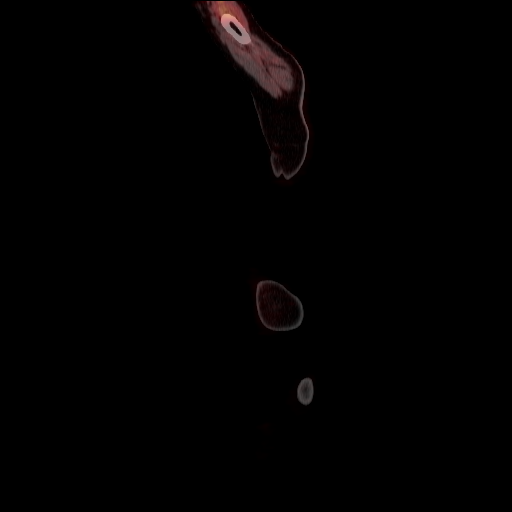

[Series 606: pet axial · 4 of 327 slices shown]
[im 1/327]
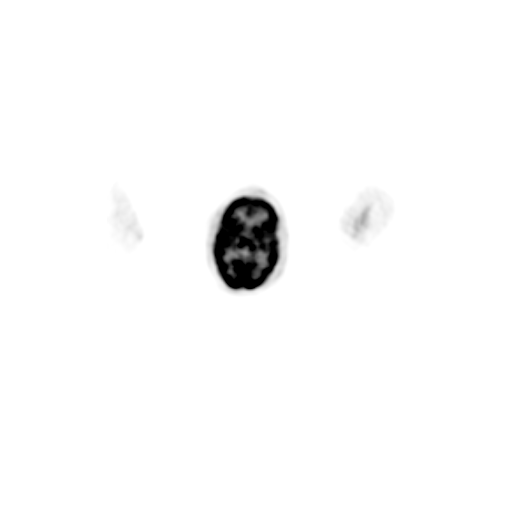
[im 109/327]
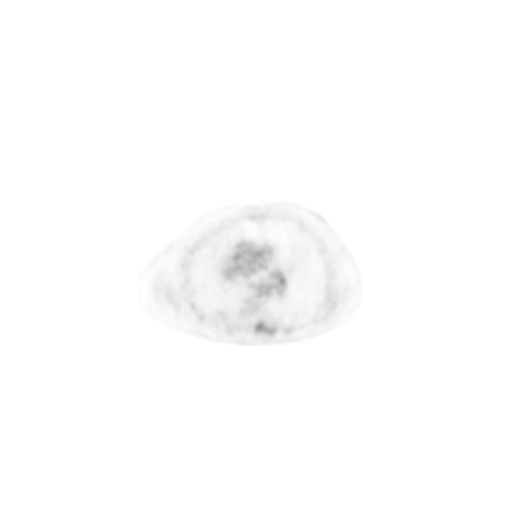
[im 218/327]
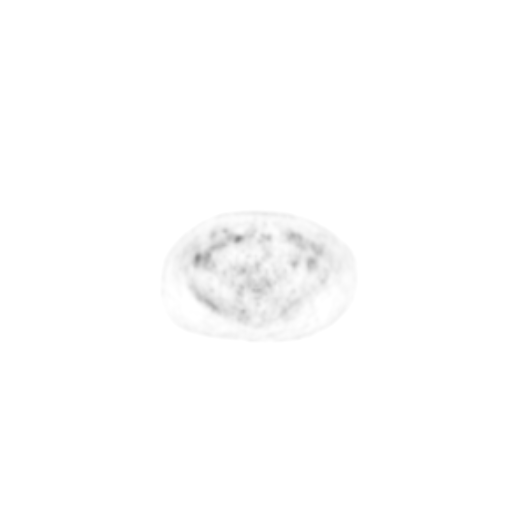
[im 327/327]
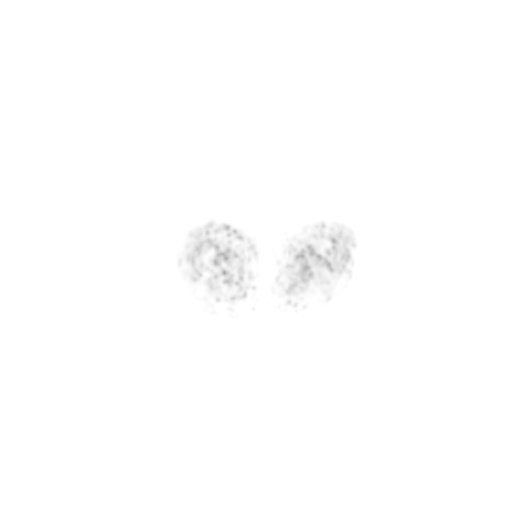

[Series 607: pet coronal · 1 of 103 slices shown]
[im 1/103]
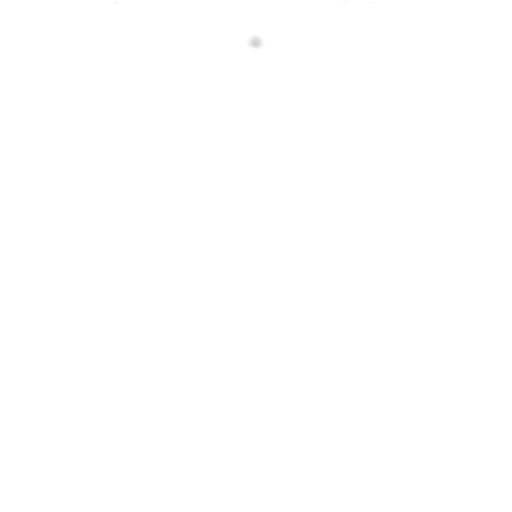

[Series 608: pet sagittal · 2 of 154 slices shown]
[im 1/154]
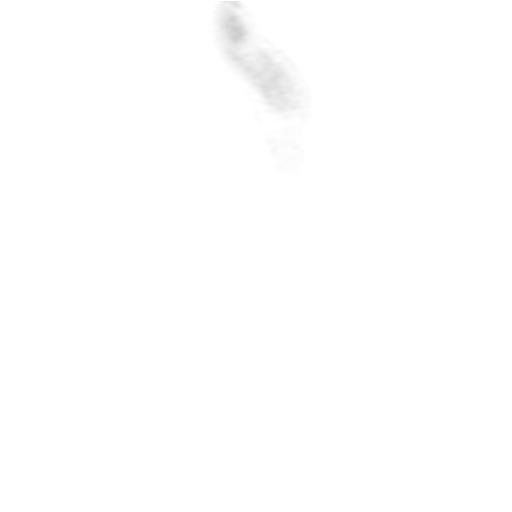
[im 154/154]
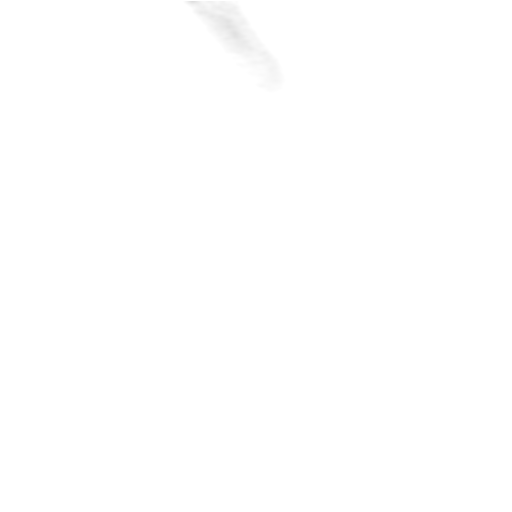

[Series 1060: results mm oncology reading · 1.0mm · 1.00mm/px · 1 of 7 slices shown]
[im 1/7]
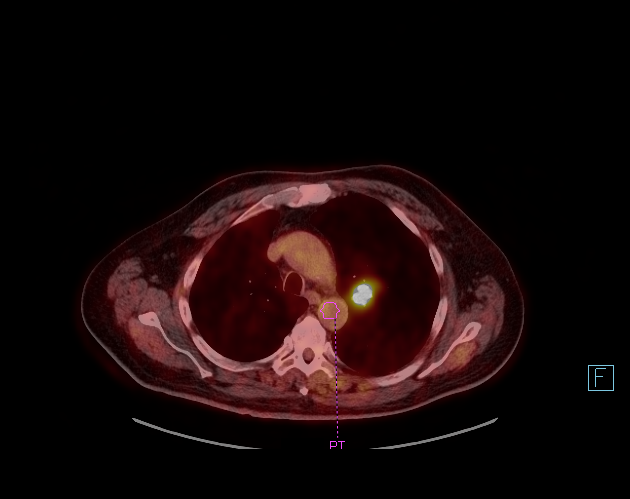

[24 of 25 positions shown; findings below may reference images not displayed]

FINDINGS: Mediastinal blood pool activity: SUV max

Liver activity: SUV max NA

NECK: No hypermetabolic lymph nodes in the neck.

Incidental CT findings: none

CHEST: Hypermetabolic LEFT suprahilar nodularity measuring 2 cm
(image 104) has intense metabolic activity SUV max equal 8.7.
Lobular hypermetabolic tissue extends into the LEFT upper lobe along
the bronchial tree branching pattern (SUV max equal 9.9)

No hypermetabolic mediastinal lymph nodes.

Calcified RIGHT lower paratracheal lymph nodes without significant
metabolic activity.

Focus of round atelectasis at the RIGHT lung base with minimal
metabolic activity.

Incidental CT findings: none

ABDOMEN/PELVIS: Twp intensely metabolic lesions within the liver.
One lesion subcapsular RIGHT hepatic lobe with SUV max equal
measures approximately 1 cm. This lesion is subtly seen on CT image
174.

Second lesion in the medial LEFT hepatic lobe measures approximately
9 mm image 156 with SUV max equal 8.2.

Potential third lesion in the subcapsular RIGHT hepatic lobe SUV max
equal 4.4 on image 167

Fourth lesion more centrally in the liver on image 170 with SUV max
equal 4.6.

Incidental CT findings: No abnormal metabolic activity in the
adrenal glands. No hypermetabolic abdominopelvic lymph nodes.

Metabolic activity through the rectum is favored related to benign
stool activity.

SKELETON: No focal hypermetabolic activity to suggest skeletal
metastasis.

Incidental CT findings: none
IMPRESSION: 1. Hypermetabolic LEFT hilar nodules consistent with metastatic
adenopathy.
2. Branching nodularity extending from the LEFT hilum into the
central LEFT upper lobe is presumed primary bronchogenic carcinoma.
3. Multiple foci of metabolic activity liver most consistent with
stage IV lung carcinoma with liver metastasis.
4. Activity through the distal rectum/anus is favored physiologic.

## 2021-09-03 DEATH — deceased
